# Patient Record
Sex: Female | Born: 1970 | Race: White | Hispanic: No | Marital: Married | Smoking: Never smoker
Health system: Southern US, Community
[De-identification: ages and names within clinical notes are randomized; demographics above are authoritative.]

## PROBLEM LIST (undated history)

## (undated) DIAGNOSIS — M1990 Unspecified osteoarthritis, unspecified site: Secondary | ICD-10-CM

## (undated) DIAGNOSIS — F419 Anxiety disorder, unspecified: Secondary | ICD-10-CM

## (undated) DIAGNOSIS — F32A Depression, unspecified: Secondary | ICD-10-CM

## (undated) DIAGNOSIS — M549 Dorsalgia, unspecified: Secondary | ICD-10-CM

## (undated) DIAGNOSIS — R42 Dizziness and giddiness: Secondary | ICD-10-CM

## (undated) DIAGNOSIS — F329 Major depressive disorder, single episode, unspecified: Secondary | ICD-10-CM

## (undated) HISTORY — PX: SHOULDER SURGERY: SHX246

## (undated) HISTORY — PX: TUBAL LIGATION: SHX77

---

## 2005-01-19 ENCOUNTER — Emergency Department: Payer: Self-pay | Admitting: Emergency Medicine

## 2005-04-05 ENCOUNTER — Emergency Department (HOSPITAL_COMMUNITY): Admission: EM | Admit: 2005-04-05 | Discharge: 2005-04-05 | Payer: Self-pay | Admitting: *Deleted

## 2005-05-28 ENCOUNTER — Emergency Department: Payer: Self-pay | Admitting: Emergency Medicine

## 2005-06-08 ENCOUNTER — Emergency Department: Payer: Self-pay | Admitting: Emergency Medicine

## 2007-02-28 ENCOUNTER — Emergency Department (HOSPITAL_COMMUNITY): Admission: EM | Admit: 2007-02-28 | Discharge: 2007-02-28 | Payer: Self-pay | Admitting: Emergency Medicine

## 2007-07-13 ENCOUNTER — Emergency Department (HOSPITAL_COMMUNITY): Admission: EM | Admit: 2007-07-13 | Discharge: 2007-07-13 | Payer: Self-pay | Admitting: Emergency Medicine

## 2007-10-31 ENCOUNTER — Emergency Department (HOSPITAL_COMMUNITY): Admission: EM | Admit: 2007-10-31 | Discharge: 2007-10-31 | Payer: Self-pay | Admitting: Emergency Medicine

## 2008-02-15 ENCOUNTER — Emergency Department (HOSPITAL_COMMUNITY): Admission: EM | Admit: 2008-02-15 | Discharge: 2008-02-15 | Payer: Self-pay | Admitting: Emergency Medicine

## 2008-03-20 ENCOUNTER — Emergency Department (HOSPITAL_COMMUNITY): Admission: EM | Admit: 2008-03-20 | Discharge: 2008-03-20 | Payer: Self-pay | Admitting: Emergency Medicine

## 2008-04-20 ENCOUNTER — Emergency Department (HOSPITAL_COMMUNITY): Admission: EM | Admit: 2008-04-20 | Discharge: 2008-04-20 | Payer: Self-pay | Admitting: Emergency Medicine

## 2008-10-07 ENCOUNTER — Emergency Department (HOSPITAL_COMMUNITY): Admission: EM | Admit: 2008-10-07 | Discharge: 2008-10-07 | Payer: Self-pay | Admitting: Emergency Medicine

## 2009-11-26 ENCOUNTER — Emergency Department: Payer: Self-pay | Admitting: Emergency Medicine

## 2010-05-20 ENCOUNTER — Emergency Department (HOSPITAL_COMMUNITY): Admission: EM | Admit: 2010-05-20 | Discharge: 2010-05-20 | Payer: Self-pay | Admitting: Emergency Medicine

## 2010-12-07 LAB — GLUCOSE, CAPILLARY: Glucose-Capillary: 99 mg/dL (ref 70–99)

## 2011-10-29 ENCOUNTER — Emergency Department: Payer: Self-pay | Admitting: Emergency Medicine

## 2012-01-28 ENCOUNTER — Emergency Department: Payer: Self-pay | Admitting: *Deleted

## 2012-02-14 ENCOUNTER — Emergency Department: Payer: Self-pay | Admitting: Unknown Physician Specialty

## 2012-06-04 ENCOUNTER — Emergency Department: Payer: Self-pay | Admitting: Emergency Medicine

## 2012-06-04 LAB — COMPREHENSIVE METABOLIC PANEL
BUN: 12 mg/dL (ref 7–18)
Bilirubin,Total: 0.2 mg/dL (ref 0.2–1.0)
Chloride: 108 mmol/L — ABNORMAL HIGH (ref 98–107)
Creatinine: 0.58 mg/dL — ABNORMAL LOW (ref 0.60–1.30)
EGFR (African American): >60
SGPT (ALT): 13 U/L (ref 12–78)
Total Protein: 8.4 g/dL — ABNORMAL HIGH (ref 6.4–8.2)

## 2012-06-04 LAB — DRUG SCREEN, URINE
Amphetamines, Ur Screen: NEGATIVE (ref ?–1000)
MDMA (Ecstasy)Ur Screen: NEGATIVE (ref ?–500)
Opiate, Ur Screen: NEGATIVE (ref ?–300)
Phencyclidine (PCP) Ur S: NEGATIVE (ref ?–25)

## 2012-06-04 LAB — URINALYSIS, COMPLETE
Bilirubin,UR: NEGATIVE
Ph: 6 (ref 4.5–8.0)
Protein: NEGATIVE
RBC,UR: 2 /HPF (ref 0–5)
Squamous Epithelial: 5

## 2012-06-04 LAB — CBC
HCT: 40.6 % (ref 35.0–47.0)
HGB: 13.9 g/dL (ref 12.0–16.0)
Platelet: 356 10*3/uL (ref 150–440)
RBC: 4.81 10*6/uL (ref 3.80–5.20)
RDW: 13.1 % (ref 11.5–14.5)

## 2012-06-04 LAB — TSH: Thyroid Stimulating Horm: 1.31 u[IU]/mL

## 2013-02-06 IMAGING — CR DG FOOT COMPLETE 3+V*L*
1 series · 3 of 3 positions shown · non-contrast
Comparison: none

REASON FOR EXAM: foot pain for one month
COMMENTS:   May transport without cardiac monitor

PROCEDURE:     DXR - DXR FOOT LT COMP W/OBLIQUES  - January 28, 2012  [DATE]
RESULT:     No acute abnormality identified.

[Series 1: x foot ap left · 0.14mm/px · 3 of 3 slices shown]
[im 1/3]
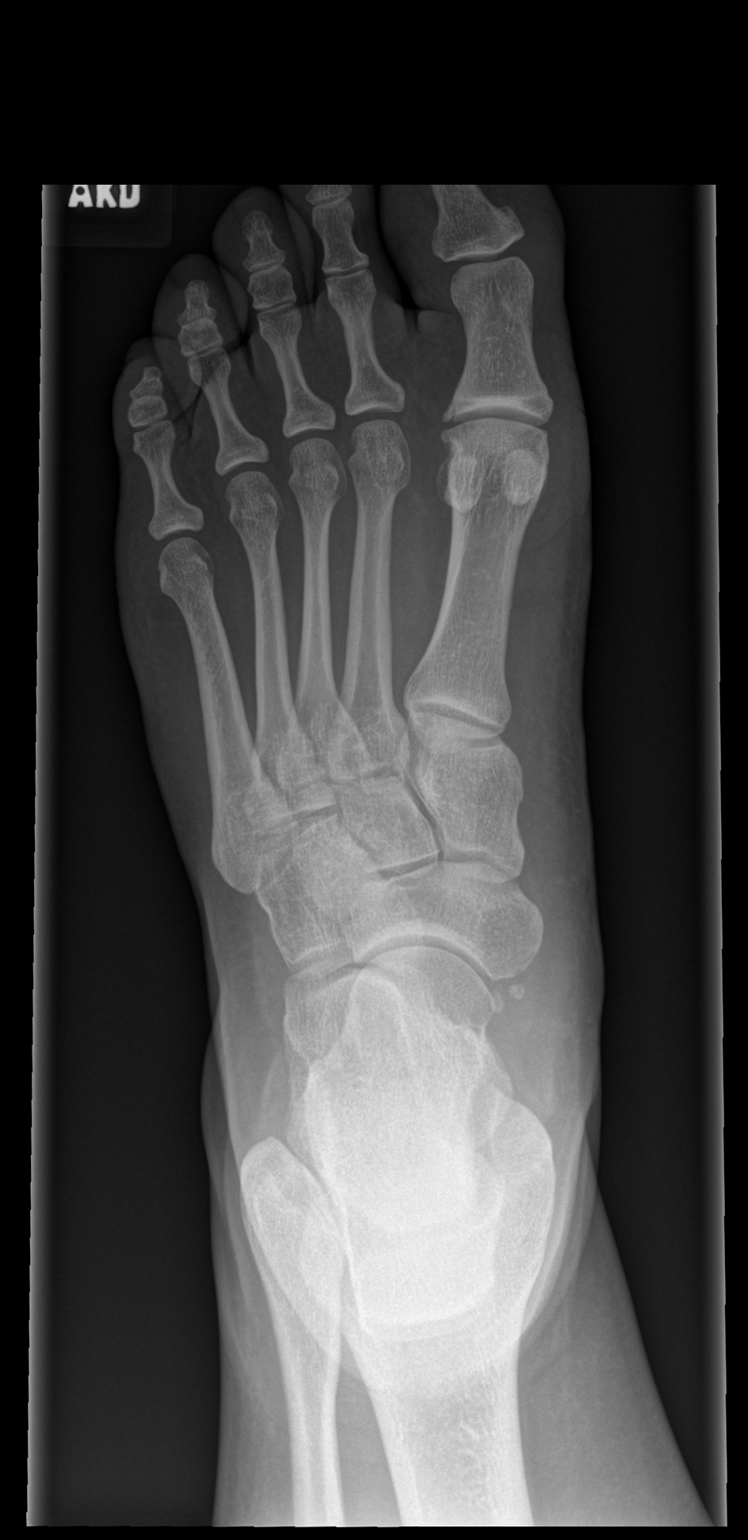
[im 2/3]
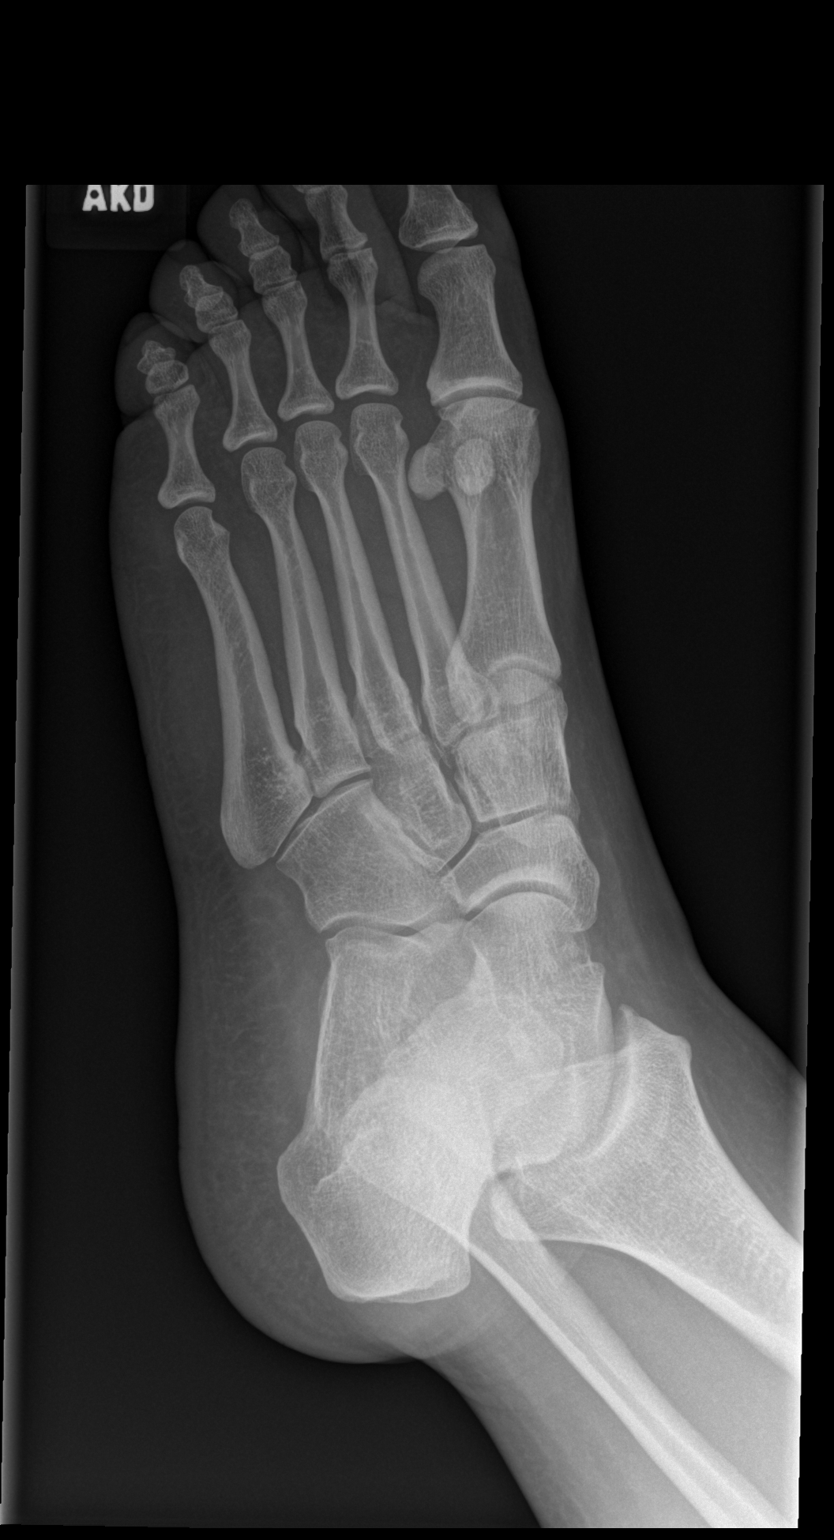
[im 3/3]
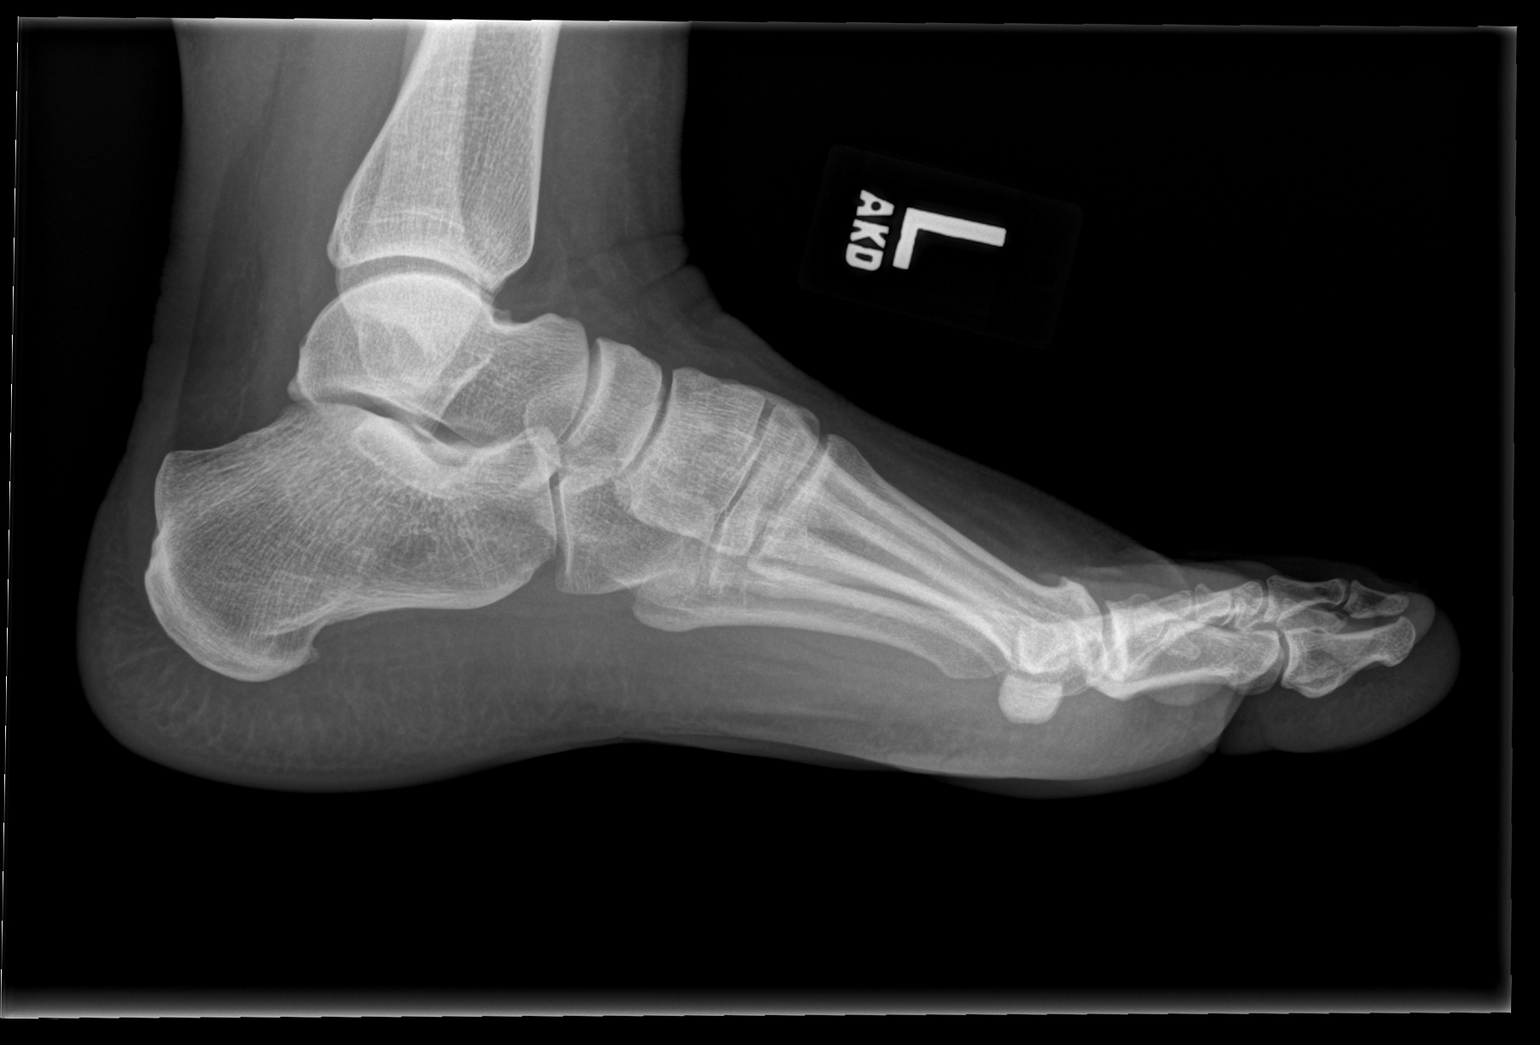

[3 of 3 positions shown; findings below may reference images not displayed]

IMPRESSION: No acute abnormality. Accessory ossicle is noted adjacent
to the navicular.

## 2013-03-10 ENCOUNTER — Emergency Department: Payer: Self-pay | Admitting: Emergency Medicine

## 2013-04-02 ENCOUNTER — Emergency Department: Payer: Self-pay | Admitting: Emergency Medicine

## 2013-07-10 ENCOUNTER — Emergency Department: Payer: Self-pay | Admitting: Emergency Medicine

## 2013-07-10 LAB — BASIC METABOLIC PANEL
Anion Gap: 7 (ref 7–16)
BUN: 10 mg/dL (ref 7–18)
Co2: 26 mmol/L (ref 21–32)
EGFR (Non-African Amer.): >60
Glucose: 136 mg/dL — ABNORMAL HIGH (ref 65–99)
Sodium: 136 mmol/L (ref 136–145)

## 2013-07-10 LAB — RAPID INFLUENZA A&B ANTIGENS

## 2013-07-10 LAB — CBC WITH DIFFERENTIAL/PLATELET
Eosinophil #: 0.1 10*3/uL (ref 0.0–0.7)
HGB: 13.8 g/dL (ref 12.0–16.0)
Lymphocyte %: 29.3 %
MCH: 29 pg (ref 26.0–34.0)
Monocyte #: 0.6 x10 3/mm (ref 0.2–0.9)
Monocyte %: 5.8 %
Neutrophil #: 7 10*3/uL — ABNORMAL HIGH (ref 1.4–6.5)
Platelet: 362 10*3/uL (ref 150–440)
RDW: 12.8 % (ref 11.5–14.5)
WBC: 11.2 10*3/uL — ABNORMAL HIGH (ref 3.6–11.0)

## 2013-07-15 ENCOUNTER — Emergency Department: Payer: Self-pay | Admitting: Emergency Medicine

## 2013-07-15 LAB — COMPREHENSIVE METABOLIC PANEL
Calcium, Total: 9.1 mg/dL (ref 8.5–10.1)
Chloride: 105 mmol/L (ref 98–107)
Co2: 28 mmol/L (ref 21–32)
Creatinine: 0.73 mg/dL (ref 0.60–1.30)
EGFR (African American): >60
Osmolality: 274 (ref 275–301)
SGOT(AST): 21 U/L (ref 15–37)
SGPT (ALT): 17 U/L (ref 12–78)
Sodium: 138 mmol/L (ref 136–145)

## 2013-07-15 LAB — URINALYSIS, COMPLETE
Bacteria: NONE SEEN
Blood: NEGATIVE
Ketone: NEGATIVE
Leukocyte Esterase: NEGATIVE
Nitrite: NEGATIVE
Ph: 5 (ref 4.5–8.0)
Protein: NEGATIVE
Squamous Epithelial: 4
WBC UR: 1 /HPF (ref 0–5)

## 2013-07-15 LAB — CBC
MCHC: 35.1 g/dL (ref 32.0–36.0)
MCV: 82 fL (ref 80–100)
Platelet: 372 10*3/uL (ref 150–440)
RBC: 4.38 10*6/uL (ref 3.80–5.20)

## 2014-05-29 ENCOUNTER — Emergency Department: Payer: Self-pay | Admitting: Emergency Medicine

## 2014-11-21 ENCOUNTER — Ambulatory Visit: Payer: Self-pay | Admitting: Family Medicine

## 2015-04-24 ENCOUNTER — Emergency Department
Admission: EM | Admit: 2015-04-24 | Discharge: 2015-04-24 | Disposition: A | Discharge status: Home / Self Care | Payer: BLUE CROSS/BLUE SHIELD | Attending: Emergency Medicine | Admitting: Emergency Medicine

## 2015-04-24 ENCOUNTER — Encounter: Payer: Self-pay | Admitting: Emergency Medicine

## 2015-04-24 DIAGNOSIS — Z3202 Encounter for pregnancy test, result negative: Secondary | ICD-10-CM | POA: Insufficient documentation

## 2015-04-24 DIAGNOSIS — K529 Noninfective gastroenteritis and colitis, unspecified: Secondary | ICD-10-CM | POA: Insufficient documentation

## 2015-04-24 DIAGNOSIS — R1084 Generalized abdominal pain: Secondary | ICD-10-CM | POA: Diagnosis present

## 2015-04-24 HISTORY — DX: Depression, unspecified: F32.A

## 2015-04-24 HISTORY — DX: Major depressive disorder, single episode, unspecified: F32.9

## 2015-04-24 HISTORY — DX: Anxiety disorder, unspecified: F41.9

## 2015-04-24 LAB — COMPREHENSIVE METABOLIC PANEL
ALT: 17 U/L (ref 14–54)
AST: 22 U/L (ref 15–41)
Albumin: 3.5 g/dL (ref 3.5–5.0)
Alkaline Phosphatase: 53 U/L (ref 38–126)
Anion gap: 7 (ref 5–15)
BUN: 10 mg/dL (ref 6–20)
CALCIUM: 9 mg/dL (ref 8.9–10.3)
CO2: 24 mmol/L (ref 22–32)
CREATININE: 0.67 mg/dL (ref 0.44–1.00)
Chloride: 104 mmol/L (ref 101–111)
GLUCOSE: 185 mg/dL — AB (ref 65–99)
POTASSIUM: 3.6 mmol/L (ref 3.5–5.1)
Sodium: 135 mmol/L (ref 135–145)
TOTAL PROTEIN: 7.8 g/dL (ref 6.5–8.1)
Total Bilirubin: 0.3 mg/dL (ref 0.3–1.2)

## 2015-04-24 LAB — URINALYSIS COMPLETE WITH MICROSCOPIC (ARMC ONLY)
Bilirubin Urine: NEGATIVE
Glucose, UA: 50 mg/dL — AB
Hgb urine dipstick: NEGATIVE
Nitrite: NEGATIVE
PH: 5 (ref 5.0–8.0)
Protein, ur: NEGATIVE mg/dL
RBC / HPF: NONE SEEN RBC/hpf (ref 0–5)
SPECIFIC GRAVITY, URINE: 1.028 (ref 1.005–1.030)

## 2015-04-24 LAB — CBC
HEMATOCRIT: 38 % (ref 35.0–47.0)
Hemoglobin: 12.9 g/dL (ref 12.0–16.0)
MCH: 27.5 pg (ref 26.0–34.0)
MCHC: 33.9 g/dL (ref 32.0–36.0)
MCV: 81.2 fL (ref 80.0–100.0)
PLATELETS: 344 10*3/uL (ref 150–440)
RBC: 4.68 MIL/uL (ref 3.80–5.20)
RDW: 12.9 % (ref 11.5–14.5)
WBC: 11.6 10*3/uL — AB (ref 3.6–11.0)

## 2015-04-24 LAB — POCT PREGNANCY, URINE: Preg Test, Ur: NEGATIVE

## 2015-04-24 LAB — LIPASE, BLOOD: LIPASE: 33 U/L (ref 22–51)

## 2015-04-24 MED ORDER — ONDANSETRON 4 MG PO TBDP: 4.0000 mg | ORAL_TABLET | Freq: Three times a day (TID) | ORAL | Status: DC | PRN

## 2015-04-24 NOTE — ED Provider Notes (Signed)
[facility] Emergency Department Provider Note  Time seen: 2:54 PM  I have reviewed the triage vital signs and the nursing notes.   HISTORY  Chief Complaint Abdominal Pain    HPI Marie Sawyer is a 44 y.o. female with a past medical history of anxiety, diabetes, depression presents to the emergency department with nausea, vomiting, diarrhea for the last 2 days. According to the patient since yesterday morning she has been having nausea, vomiting, diarrhea. States the vomiting has resolved, but continues with nausea and diarrhea today. Patient missed work last night. States some mild diffuse abdominal cramping, but no "pain". Denies fever, black or bloody stool or vomit, chest pain, shortness of breath. Denies any modifying factors.     Past Medical History  Diagnosis Date  . Diabetes mellitus without complication     borderline  . Depression   . Anxiety     There are no active problems to display for this patient.   Past Surgical History  Procedure Laterality Date  . Cesarean section    . Tubal ligation      No current outpatient prescriptions on file.  Allergies Review of patient's allergies indicates no known allergies.  No family history on file.  Social History History  Substance Use Topics  . Smoking status: Never Smoker   . Smokeless tobacco: Not on file  . Alcohol Use: No    Review of Systems Constitutional: Negative for fever. Cardiovascular: Negative for chest pain. Respiratory: Negative for shortness of breath. Gastrointestinal: Mild abdominal cramping, positive for nausea, vomiting, diarrhea. Genitourinary: Negative for dysuria. Musculoskeletal: Negative for back pain Neurological: Negative for headache 10-point ROS otherwise negative.  ____________________________________________   PHYSICAL EXAM:  VITAL SIGNS: ED Triage Vitals  Enc Vitals Group     BP 04/24/15 1147 121/76 mmHg     Pulse Rate 04/24/15 1147 88      Resp 04/24/15 1147 20     Temp 04/24/15 1147 98.5 F (36.9 C)     Temp Source 04/24/15 1147 Oral     SpO2 04/24/15 1147 98 %     Weight 04/24/15 1147 175 lb (79.379 kg)     Height 04/24/15 1147  (1.499 m)     Head Cir --      Peak Flow --      Pain Score 04/24/15 1156 6     Pain Loc --      Pain Edu? --      Excl. in GC? --     Constitutional: Alert and oriented. Well appearing and in no distress. Eyes: Normal exam ENT   Mouth/Throat: Mucous membranes are moist. Cardiovascular: Normal rate, regular rhythm. No murmur Respiratory: Normal respiratory effort without tachypnea nor retractions. Breath sounds are clear and equal bilaterally. No wheezes/rales/rhonchi. Gastrointestinal: Soft and nontender. No distention.  Musculoskeletal: Nontender with normal range of motion in all extremities Neurologic:  Normal speech and language. No gross focal neurologic deficits Skin:  Skin is warm, dry and intact.  Psychiatric: Mood and affect are normal. Speech and behavior are normal.   ____________________________________________   INITIAL IMPRESSION / ASSESSMENT AND PLAN / ED COURSE  Pertinent labs & imaging results that were available during my care of the patient were reviewed by me and considered in my medical decision making (see chart for details).  Labs are within normal limits. Urinalysis shows no sign of urinary tract infection. Slight white blood cell count elevation at 11.6. Patient with likely gastroenteritis. We will discharge  with Zofran as needed, and over-the-counter Imodium as needed. Patient agreeable to plan. I encourage by mouth fluids.  ____________________________________________   FINAL CLINICAL IMPRESSION(S) / ED DIAGNOSES  Gastroenteritis   Minna Antis, MD 04/24/15 1459

## 2015-04-24 NOTE — Discharge Instructions (Signed)

## 2015-04-24 NOTE — ED Notes (Signed)
Pt reports abdominal pain since Friday, reports lower abdominal pain. Pt with vomiting and diarrhea.

## 2015-08-25 ENCOUNTER — Encounter: Payer: Self-pay | Admitting: General Practice

## 2015-08-25 ENCOUNTER — Emergency Department: Payer: BLUE CROSS/BLUE SHIELD

## 2015-08-25 ENCOUNTER — Emergency Department
Admission: EM | Admit: 2015-08-25 | Discharge: 2015-08-25 | Disposition: A | Discharge status: Home / Self Care | Payer: BLUE CROSS/BLUE SHIELD | Attending: Emergency Medicine | Admitting: Emergency Medicine

## 2015-08-25 DIAGNOSIS — Z79899 Other long term (current) drug therapy: Secondary | ICD-10-CM | POA: Insufficient documentation

## 2015-08-25 DIAGNOSIS — M546 Pain in thoracic spine: Secondary | ICD-10-CM | POA: Diagnosis not present

## 2015-08-25 DIAGNOSIS — E119 Type 2 diabetes mellitus without complications: Secondary | ICD-10-CM | POA: Diagnosis not present

## 2015-08-25 DIAGNOSIS — R079 Chest pain, unspecified: Secondary | ICD-10-CM | POA: Insufficient documentation

## 2015-08-25 DIAGNOSIS — M549 Dorsalgia, unspecified: Secondary | ICD-10-CM | POA: Diagnosis present

## 2015-08-25 LAB — COMPREHENSIVE METABOLIC PANEL
ALBUMIN: 3.4 g/dL — AB (ref 3.5–5.0)
ALK PHOS: 48 U/L (ref 38–126)
ALT: 13 U/L — ABNORMAL LOW (ref 14–54)
ANION GAP: 6 (ref 5–15)
AST: 19 U/L (ref 15–41)
BUN: 10 mg/dL (ref 6–20)
CHLORIDE: 108 mmol/L (ref 101–111)
CO2: 24 mmol/L (ref 22–32)
Calcium: 9 mg/dL (ref 8.9–10.3)
Creatinine, Ser: 0.67 mg/dL (ref 0.44–1.00)
GFR calc Af Amer: >60 mL/min (ref >60)
GFR calc non Af Amer: >60 mL/min (ref >60)
GLUCOSE: 136 mg/dL — AB (ref 65–99)
POTASSIUM: 3.8 mmol/L (ref 3.5–5.1)
SODIUM: 138 mmol/L (ref 135–145)
Total Bilirubin: <0.1 mg/dL — ABNORMAL LOW (ref 0.3–1.2)
Total Protein: 7.4 g/dL (ref 6.5–8.1)

## 2015-08-25 LAB — CBC
HCT: 37.3 % (ref 35.0–47.0)
HEMOGLOBIN: 12.6 g/dL (ref 12.0–16.0)
MCH: 27.8 pg (ref 26.0–34.0)
MCHC: 33.8 g/dL (ref 32.0–36.0)
MCV: 82.4 fL (ref 80.0–100.0)
PLATELETS: 342 10*3/uL (ref 150–440)
RBC: 4.53 MIL/uL (ref 3.80–5.20)
RDW: 13.2 % (ref 11.5–14.5)
WBC: 10.9 10*3/uL (ref 3.6–11.0)

## 2015-08-25 LAB — FIBRIN DERIVATIVES D-DIMER (ARMC ONLY): Fibrin derivatives D-dimer (ARMC): 502 — ABNORMAL HIGH (ref 0–499)

## 2015-08-25 LAB — TROPONIN I: Troponin I: <0.03 ng/mL (ref <0.031)

## 2015-08-25 MED ORDER — OXYCODONE-ACETAMINOPHEN 5-325 MG PO TABS
ORAL_TABLET | ORAL | Status: AC
  Administered 2015-08-25: 1 via ORAL
  Filled 2015-08-25: qty 1

## 2015-08-25 MED ORDER — IOHEXOL 350 MG/ML SOLN
100.0000 mL | Freq: Once | INTRAVENOUS | Status: AC | PRN
  Administered 2015-08-25: 100 mL via INTRAVENOUS

## 2015-08-25 MED ORDER — OXYCODONE-ACETAMINOPHEN 5-325 MG PO TABS
1.0000 | ORAL_TABLET | Freq: Once | ORAL | Status: AC
  Administered 2015-08-25: 1 via ORAL

## 2015-08-25 MED ORDER — HYDROCODONE-ACETAMINOPHEN 5-325 MG PO TABS: 1.0000 | ORAL_TABLET | ORAL | Status: DC | PRN

## 2015-08-25 NOTE — ED Notes (Signed)
Patient advised not to return to work tonight due to medication given in ED. Patient verbalized understanding.

## 2015-08-25 NOTE — ED Notes (Signed)
Pt in with co upper back pain for few days, denies any known injury.

## 2015-08-25 NOTE — ED Notes (Signed)
Patient transported to X-ray 

## 2015-08-25 NOTE — Discharge Instructions (Signed)
Back Pain, Adult Back pain is very common. The pain often gets better over time. The cause of back pain is usually not dangerous. Most people can learn to manage their back pain on their own.  HOME CARE  Watch your back pain for any changes. The following actions may help to lessen any pain you are feeling:  Stay active. Start with short walks on flat ground if you can. Try to walk farther each day.  Exercise regularly as told by your doctor. Exercise helps your back heal faster. It also helps avoid future injury by keeping your muscles strong and flexible.  Do not sit, drive, or stand in one place for more than 30 minutes.  Do not stay in bed. Resting more than 1-2 days can slow down your recovery.  Be careful when you bend or lift an object. Use good form when lifting:  Bend at your knees.  Keep the object close to your body.  Do not twist.  Sleep on a firm mattress. Lie on your side, and bend your knees. If you lie on your back, put a pillow under your knees.  Take medicines only as told by your doctor.  Put ice on the injured area.  Put ice in a plastic bag.  Place a towel between your skin and the bag.  Leave the ice on for 20 minutes, 2-3 times a day for the first 2-3 days. After that, you can switch between ice and heat packs.  Avoid feeling anxious or stressed. Find good ways to deal with stress, such as exercise.  Maintain a healthy weight. Extra weight puts stress on your back. GET HELP IF:   You have pain that does not go away with rest or medicine.  You have worsening pain that goes down into your legs or buttocks.  You have pain that does not get better in one week.  You have pain at night.  You lose weight.  You have a fever or chills. GET HELP RIGHT AWAY IF:   You cannot control when you poop (bowel movement) or pee (urinate).  Your arms or legs feel weak.  Your arms or legs lose feeling (numbness).  You feel sick to your stomach (nauseous) or  throw up (vomit).  You have belly (abdominal) pain.  You feel like you may pass out (faint).   This information is not intended to replace advice given to you by your health care provider. Make sure you discuss any questions you have with your health care provider.   Document Released: 02/26/2008 Document Revised: 09/30/2014 Document Reviewed: 01/11/2014 Elsevier Interactive Patient Education 2016 [employer]  Chest Wall Pain Chest wall pain is pain in or around the bones and muscles of your chest. Sometimes, an injury causes this pain. Sometimes, the cause may not be known. This pain may take several weeks or longer to get better. HOME CARE INSTRUCTIONS  Pay attention to any changes in your symptoms. Take these actions to help with your pain:   Rest as told by your health care provider.   Avoid activities that cause pain. These include any activities that use your chest muscles or your abdominal and side muscles to lift heavy items.   If directed, apply ice to the painful area:  Put ice in a plastic bag.  Place a towel between your skin and the bag.  Leave the ice on for 20 minutes, 2-3 times per day.  Take over-the-counter and prescription medicines only as told by your  health care provider.  Do not use tobacco products, including cigarettes, chewing tobacco, and e-cigarettes. If you need help quitting, ask your health care provider.  Keep all follow-up visits as told by your health care provider. This is important. SEEK MEDICAL CARE IF:  You have a fever.  Your chest pain becomes worse.  You have new symptoms. SEEK IMMEDIATE MEDICAL CARE IF:  You have nausea or vomiting.  You feel sweaty or light-headed.  You have a cough with phlegm (sputum) or you cough up blood.  You develop shortness of breath.   This information is not intended to replace advice given to you by your health care provider. Make sure you discuss any questions you have with your health care  provider.   Document Released: 09/09/2005 Document Revised: 05/31/2015 Document Reviewed: 12/05/2014 Elsevier Interactive Patient Education [employer]

## 2015-08-25 NOTE — ED Notes (Signed)
Patient transported to CT 

## 2015-08-25 NOTE — ED Provider Notes (Signed)
[facility] Emergency Department Provider Note  Time seen: 2:27 AM  I have reviewed the triage vital signs and the nursing notes.   HISTORY  Chief Complaint Back Pain    HPI Marie Sawyer is a 44 y.o. female with a past medical history of diabetes, anxiety, depression who presents the emergency department with right posterior chest pain. According to the patient she was asleep when she was awoken by significant right posterior chest pain. States this is much worse with movement, somewhat worse with deep inspiration. She states she has been coughing over the last 2-3 days. Denies any trauma to the area. The patient does take birth control to regulate her period ends. Denies any recent leg pain or swelling.     Past Medical History  Diagnosis Date  . Diabetes mellitus without complication (HCC)     borderline  . Depression   . Anxiety     There are no active problems to display for this patient.   Past Surgical History  Procedure Laterality Date  . Cesarean section    . Tubal ligation      Current Outpatient Rx  Name  Route  Sig  Dispense  Refill  . buPROPion (WELLBUTRIN XL) 300 MG 24 hr tablet   Oral   Take 300 mg by mouth daily.         . clonazePAM (KLONOPIN) 1 MG tablet   Oral   Take 1 mg by mouth 2 (two) times daily as needed for anxiety.         . traMADol (ULTRAM) 50 MG tablet   Oral   Take 50 mg by mouth every 6 (six) hours as needed for moderate pain.         Marland Kitchen. zolpidem (AMBIEN) 10 MG tablet   Oral   Take 10 mg by mouth at bedtime as needed for sleep.         Marland Kitchen. ondansetron (ZOFRAN ODT) 4 MG disintegrating tablet   Oral   Take 1 tablet (4 mg total) by mouth every 8 (eight) hours as needed for nausea or vomiting.   20 tablet   0     Allergies Review of patient's allergies indicates no known allergies.  History reviewed. No pertinent family history.  Social History Social History  Substance Use Topics  .  Smoking status: Never Smoker   . Smokeless tobacco: None  . Alcohol Use: No    Review of Systems Constitutional: Negative for fever. Cardiovascular: Positive right upper back/right posterior chest pain Respiratory: Negative for shortness of breath. Gastrointestinal: Negative for abdominal pain Musculoskeletal: Positive for right upper back pain Neurological: Negative for headache 10-point ROS otherwise negative.  ____________________________________________   PHYSICAL EXAM:  VITAL SIGNS: ED Triage Vitals  Enc Vitals Group     BP 08/25/15 0200 156/89 mmHg     Pulse Rate 08/25/15 0200 106     Resp 08/25/15 0200 18     Temp 08/25/15 0200 98.5 F (36.9 C)     Temp Source 08/25/15 0200 Oral     SpO2 08/25/15 0200 96 %     Weight 08/25/15 0200 170 lb (77.111 kg)     Height 08/25/15 0200 4\' 11"  (1.499 m)     Head Cir --      Peak Flow --      Pain Score 08/25/15 0200 10     Pain Loc --      Pain Edu? --      Excl.  in GC? --     Constitutional: Alert and oriented. Well appearing and in no distress. Eyes: Normal exam ENT   Head: Normocephalic and atraumatic.   Mouth/Throat: Mucous membranes are moist. Cardiovascular: Normal rate, regular rhythm. No murmur Respiratory: Normal respiratory effort without tachypnea nor retractions. Breath sounds are clear. Significant tenderness to palpation below the right scapula. Gastrointestinal: Soft and nontender. No distention. Musculoskeletal: Nontender with normal range of motion in all extremities. No lower extremity tenderness or edema. Neurologic:  Normal speech and language. No gross focal neurologic deficits Skin:  Skin is warm, dry and intact.  Psychiatric: Mood and affect are normal. Speech and behavior are normal.   ____________________________________________    EKG  EKG reviewed and interpreted by myself shows normal sinus rhythm at 83 bpm, narrow QRS, normal axis, normal and full, no ST changes  present.  ____________________________________________    RADIOLOGY  CT a negative  ____________________________________________   INITIAL IMPRESSION / ASSESSMENT AND PLAN / ED COURSE  Pertinent labs & imaging results that were available during my care of the patient were reviewed by me and considered in my medical decision making (see chart for details).  Patient presented to the emergency department with right posterior chest pain. States it occurred acutely this evening around 6-7 p.m. Has not improved. Patient is on estrogen to regulate her periods, denies any recent leg pain or swelling. Denies any history of DVT. Denies any shortness of breath. Patient does have mild tachycardia but admits she is very anxious. Given the acute onset of pain during rest we'll proceed with lab workup and a chest x-ray. We'll also obtain a d-dimer.  D-dimer has resulted at 502. We'll pursue the CT angiography of the chest to rule out pulmonary emboli. Patient is agreeable to this plan.  CT angiography negative. Labs within normal limits. We'll discharge home with a short course of pain medication for likely musculoskeletal pain. Patient agreeable  ____________________________________________   FINAL CLINICAL IMPRESSION(S) / ED DIAGNOSES  Right posterior chest pain   Minna Antis, MD 08/25/15 [phone]

## 2015-12-03 DIAGNOSIS — Z79899 Other long term (current) drug therapy: Secondary | ICD-10-CM | POA: Diagnosis not present

## 2015-12-03 DIAGNOSIS — M5442 Lumbago with sciatica, left side: Secondary | ICD-10-CM | POA: Insufficient documentation

## 2015-12-03 DIAGNOSIS — M545 Low back pain: Secondary | ICD-10-CM | POA: Diagnosis present

## 2015-12-03 NOTE — ED Notes (Signed)
Pt states that she has been having lower back pain for the past couple days but states off and on for years, pt denies recent injury, states while at work the other night she was having difficulty walking and moving due to the pain

## 2015-12-04 ENCOUNTER — Emergency Department
Admission: EM | Admit: 2015-12-04 | Discharge: 2015-12-04 | Disposition: A | Discharge status: Home / Self Care | Payer: BLUE CROSS/BLUE SHIELD | Attending: Emergency Medicine | Admitting: Emergency Medicine

## 2015-12-04 ENCOUNTER — Encounter: Payer: Self-pay | Admitting: Emergency Medicine

## 2015-12-04 DIAGNOSIS — M5442 Lumbago with sciatica, left side: Secondary | ICD-10-CM

## 2015-12-04 HISTORY — DX: Dorsalgia, unspecified: M54.9

## 2015-12-04 MED ORDER — LIDOCAINE 5 % EX PTCH: 1.0000 | MEDICATED_PATCH | CUTANEOUS | Status: DC

## 2015-12-04 MED ORDER — LORAZEPAM 0.5 MG PO TABS
ORAL_TABLET | ORAL | Status: AC
  Filled 2015-12-04: qty 1

## 2015-12-04 MED ORDER — DIAZEPAM 5 MG PO TABS
5.0000 mg | ORAL_TABLET | Freq: Once | ORAL | Status: AC
  Administered 2015-12-04: 5 mg via ORAL
  Filled 2015-12-04: qty 1

## 2015-12-04 MED ORDER — LIDOCAINE 5 % EX PTCH
1.0000 | MEDICATED_PATCH | CUTANEOUS | Status: DC
  Administered 2015-12-04: 1 via TRANSDERMAL
  Filled 2015-12-04 (×2): qty 1

## 2015-12-04 MED ORDER — ETODOLAC 200 MG PO CAPS: 200.0000 mg | ORAL_CAPSULE | Freq: Three times a day (TID) | ORAL | Status: DC

## 2015-12-04 MED ORDER — KETOROLAC TROMETHAMINE 60 MG/2ML IM SOLN
60.0000 mg | Freq: Once | INTRAMUSCULAR | Status: AC
  Administered 2015-12-04: 60 mg via INTRAMUSCULAR
  Filled 2015-12-04: qty 2

## 2015-12-04 NOTE — Discharge Instructions (Signed)
Chronic Back Pain ° When back pain lasts longer than 3 months, it is called chronic back pain. People with chronic back pain often go through certain periods that are more intense (flare-ups).  °CAUSES °Chronic back pain can be caused by wear and tear (degeneration) on different structures in your back. These structures include: °· The bones of your spine (vertebrae) and the joints surrounding your spinal cord and nerve roots (facets). °· The strong, fibrous tissues that connect your vertebrae (ligaments). °Degeneration of these structures may result in pressure on your nerves. This can lead to constant pain. °HOME CARE INSTRUCTIONS °· Avoid bending, heavy lifting, prolonged sitting, and activities which make the problem worse. °· Take brief periods of rest throughout the day to reduce your pain. Lying down or standing usually is better than sitting while you are resting. °· Take over-the-counter or prescription medicines only as directed by your caregiver. °SEEK IMMEDIATE MEDICAL CARE IF:  °· You have weakness or numbness in one of your legs or feet. °· You have trouble controlling your bladder or bowels. °· You have nausea, vomiting, abdominal pain, shortness of breath, or fainting. °  °This information is not intended to replace advice given to you by your health care provider. Make sure you discuss any questions you have with your health care provider. °  °Document Released: 10/17/2004 Document Revised: 12/02/2011 Document Reviewed: 02/27/2015 °Elsevier Interactive Patient Education ©2016 Elsevier Inc. ° °Sciatica °Sciatica is pain, weakness, numbness, or tingling along the path of the sciatic nerve. The nerve starts in the lower back and runs down the back of each leg. The nerve controls the muscles in the lower leg and in the back of the knee, while also providing sensation to the back of the thigh, lower leg, and the sole of your foot. Sciatica is a symptom of another medical condition. For instance, nerve  damage or certain conditions, such as a herniated disk or bone spur on the spine, pinch or put pressure on the sciatic nerve. This causes the pain, weakness, or other sensations normally associated with sciatica. Generally, sciatica only affects one side of the body. °CAUSES  °· Herniated or slipped disc. °· Degenerative disk disease. °· A pain disorder involving the narrow muscle in the buttocks (piriformis syndrome). °· Pelvic injury or fracture. °· Pregnancy. °· Tumor (rare). °SYMPTOMS  °Symptoms can vary from mild to very severe. The symptoms usually travel from the low back to the buttocks and down the back of the leg. Symptoms can include: °· Mild tingling or dull aches in the lower back, leg, or hip. °· Numbness in the back of the calf or sole of the foot. °· Burning sensations in the lower back, leg, or hip. °· Sharp pains in the lower back, leg, or hip. °· Leg weakness. °· Severe back pain inhibiting movement. °These symptoms may get worse with coughing, sneezing, laughing, or prolonged sitting or standing. Also, being overweight may worsen symptoms. °DIAGNOSIS  °Your caregiver will perform a physical exam to look for common symptoms of sciatica. He or she may ask you to do certain movements or activities that would trigger sciatic nerve pain. Other tests may be performed to find the cause of the sciatica. These may include: °· Blood tests. °· X-rays. °· Imaging tests, such as an MRI or CT scan. °TREATMENT  °Treatment is directed at the cause of the sciatic pain. Sometimes, treatment is not necessary and the pain and discomfort goes away on its own. If treatment is needed, your   caregiver may suggest: °· Over-the-counter medicines to relieve pain. °· Prescription medicines, such as anti-inflammatory medicine, muscle relaxants, or narcotics. °· Applying heat or ice to the painful area. °· Steroid injections to lessen pain, irritation, and inflammation around the nerve. °· Reducing activity during periods of  pain. °· Exercising and stretching to strengthen your abdomen and improve flexibility of your spine. Your caregiver may suggest losing weight if the extra weight makes the back pain worse. °· Physical therapy. °· Surgery to eliminate what is pressing or pinching the nerve, such as a bone spur or part of a herniated disk. °HOME CARE INSTRUCTIONS  °· Only take over-the-counter or prescription medicines for pain or discomfort as directed by your caregiver. °· Apply ice to the affected area for 20 minutes, 3-4 times a day for the first 48-72 hours. Then try heat in the same way. °· Exercise, stretch, or perform your usual activities if these do not aggravate your pain. °· Attend physical therapy sessions as directed by your caregiver. °· Keep all follow-up appointments as directed by your caregiver. °· Do not wear high heels or shoes that do not provide proper support. °· Check your mattress to see if it is too soft. A firm mattress may lessen your pain and discomfort. °SEEK IMMEDIATE MEDICAL CARE IF:  °· You lose control of your bowel or bladder (incontinence). °· You have increasing weakness in the lower back, pelvis, buttocks, or legs. °· You have redness or swelling of your back. °· You have a burning sensation when you urinate. °· You have pain that gets worse when you lie down or awakens you at night. °· Your pain is worse than you have experienced in the past. °· Your pain is lasting longer than 4 weeks. °· You are suddenly losing weight without reason. °MAKE SURE YOU: °· Understand these instructions. °· Will watch your condition. °· Will get help right away if you are not doing well or get worse. °  °This information is not intended to replace advice given to you by your health care provider. Make sure you discuss any questions you have with your health care provider. °  °Document Released: 09/03/2001 Document Revised: 05/31/2015 Document Reviewed: 01/19/2012 °Elsevier Interactive Patient Education ©2016  [employer] ° °

## 2015-12-04 NOTE — ED Notes (Signed)
Pt advised cannot drive after received valium. Pt states daughter will driver her home.

## 2015-12-04 NOTE — ED Provider Notes (Signed)
[facility] Emergency Department Provider Note  ____________________________________________  Time seen: Approximately 0047 AM  I have reviewed the triage vital signs and the nursing notes.   HISTORY  Chief Complaint Back Pain    HPI Shelva Majesticracy M Cercone is a 45 y.o. female comes into the hospital with a complaint of back pain. The patient reports that she has had problems with her back on and off and it seems to have flared up. The patient reports the symptoms started when she was at work on Thursday. She denies any recent trauma but reports that she did fall at work a year ago. The patient reports that this time it just started out of nowhere. The patient has been taking tramadol as needed but reports it only helps for a second. She reports the pain is across her lower back and it shoots down her left leg. The patient rates her pain a 7 out of 10 in intensity but reports is not as bad as it is been in the past. She reports though that it hurts to walk and move which is what brought her into the hospital today. The patient has not followed up with her primary care physician.   Past Medical History  Diagnosis Date  . Diabetes mellitus without complication (HCC)     borderline  . Depression   . Anxiety   . Back pain     There are no active problems to display for this patient.   Past Surgical History  Procedure Laterality Date  . Cesarean section    . Tubal ligation      Current Outpatient Rx  Name  Route  Sig  Dispense  Refill  . buPROPion (WELLBUTRIN XL) 300 MG 24 hr tablet   Oral   Take 300 mg by mouth daily.         . clonazePAM (KLONOPIN) 1 MG tablet   Oral   Take 1 mg by mouth 2 (two) times daily as needed for anxiety.         Marland Kitchen. etodolac (LODINE) 200 MG capsule   Oral   Take 1 capsule (200 mg total) by mouth every 8 (eight) hours.   12 capsule   0   . HYDROcodone-acetaminophen (NORCO/VICODIN) 5-325 MG tablet   Oral   Take 1 tablet by  mouth every 4 (four) hours as needed for moderate pain.   15 tablet   0   . lidocaine (LIDODERM) 5 %   Transdermal   Place 1 patch onto the skin daily. Remove & Discard patch within 12 hours or as directed by MD   10 patch   0   . ondansetron (ZOFRAN ODT) 4 MG disintegrating tablet   Oral   Take 1 tablet (4 mg total) by mouth every 8 (eight) hours as needed for nausea or vomiting.   20 tablet   0   . traMADol (ULTRAM) 50 MG tablet   Oral   Take 50 mg by mouth every 6 (six) hours as needed for moderate pain.         Marland Kitchen. zolpidem (AMBIEN) 10 MG tablet   Oral   Take 10 mg by mouth at bedtime as needed for sleep.           Allergies Review of patient's allergies indicates no known allergies.  History reviewed. No pertinent family history.  Social History Social History  Substance Use Topics  . Smoking status: Never Smoker   . Smokeless tobacco: None  .  Alcohol Use: No    Review of Systems Constitutional: No fever/chills Eyes: No visual changes. ENT: No sore throat. Cardiovascular: Denies chest pain. Respiratory: Denies shortness of breath. Gastrointestinal: No abdominal pain.  No nausea, no vomiting.  No diarrhea.  No constipation. Genitourinary: Negative for dysuria. Musculoskeletal: Back pain with pain radiating down her left leg Skin: Negative for rash. Neurological: Negative for headaches, focal weakness or numbness.  10-point ROS otherwise negative.  ____________________________________________   PHYSICAL EXAM:  VITAL SIGNS: ED Triage Vitals  Enc Vitals Group     BP 12/03/15 2347 108/78 mmHg     Pulse Rate 12/03/15 2347 93     Resp 12/03/15 2347 18     Temp 12/03/15 2347 98 F (36.7 C)     Temp Source 12/03/15 2347 Oral     SpO2 12/03/15 2347 97 %     Weight 12/03/15 2347 165 lb (74.844 kg)     Height 12/03/15 2347  (1.499 m)     Head Cir --      Peak Flow --      Pain Score 12/04/15 0030 8     Pain Loc --      Pain Edu? --       Excl. in GC? --     Constitutional: Alert and oriented. Well appearing and in mild distress. Eyes: Conjunctivae are normal. PERRL. EOMI. Head: Atraumatic. Nose: No congestion/rhinnorhea. Mouth/Throat: Mucous membranes are moist.  Oropharynx non-erythematous. Cardiovascular: Normal rate, regular rhythm. Grossly normal heart sounds.  Good peripheral circulation. Respiratory: Normal respiratory effort.  No retractions. Lungs CTAB. Gastrointestinal: Soft and nontender. No distention. Positive bowel sounds Musculoskeletal: No lower extremity tenderness nor edema.  Tenderness to palpation across lower back with positive straight leg raise bilaterally Neurologic:  Normal speech and language.  Skin:  Skin is warm, dry and intact.  Psychiatric: Mood and affect are normal.   ____________________________________________   LABS (all labs ordered are listed, but only abnormal results are displayed)  Labs Reviewed - No data to display ____________________________________________  EKG  None ____________________________________________  RADIOLOGY  None ____________________________________________   PROCEDURES  Procedure(s) performed: None  Critical Care performed: No  ____________________________________________   INITIAL IMPRESSION / ASSESSMENT AND PLAN / ED COURSE  Pertinent labs & imaging results that were available during my care of the patient were reviewed by me and considered in my medical decision making (see chart for details).  This is a 30 old female with a history of chronic back pain who comes into the hospital today with some back pain that flared up approximately 3-4 days ago. I will give the patient a dose of Toradol as well as a Lidoderm patch. I will also give the patient a dose of Valium and reassess her pain.  Patient's pain is improved at this time. I will discharge the patient home and have her follow-up with her primary care  physician. ____________________________________________   FINAL CLINICAL IMPRESSION(S) / ED DIAGNOSES  Final diagnoses:  Left-sided low back pain with left-sided sciatica      Rebecka Apley, MD 12/04/15 [phone]

## 2015-12-04 NOTE — ED Notes (Signed)
Report received from laurie, rn for lunch relief. 

## 2016-01-23 ENCOUNTER — Ambulatory Visit: Payer: Self-pay | Admitting: Family Medicine

## 2016-01-29 ENCOUNTER — Encounter: Payer: Self-pay | Admitting: Family Medicine

## 2016-01-29 ENCOUNTER — Ambulatory Visit (INDEPENDENT_AMBULATORY_CARE_PROVIDER_SITE_OTHER): Payer: Self-pay | Admitting: Family Medicine

## 2016-01-29 VITALS — BP 104/72 | HR 85 | Temp 97.8°F | Resp 16 | Ht 59.0 in | Wt 181.0 lb

## 2016-01-29 DIAGNOSIS — E785 Hyperlipidemia, unspecified: Secondary | ICD-10-CM

## 2016-01-29 DIAGNOSIS — M545 Low back pain, unspecified: Secondary | ICD-10-CM | POA: Insufficient documentation

## 2016-01-29 DIAGNOSIS — F32A Depression, unspecified: Secondary | ICD-10-CM

## 2016-01-29 DIAGNOSIS — R519 Headache, unspecified: Secondary | ICD-10-CM | POA: Insufficient documentation

## 2016-01-29 DIAGNOSIS — R202 Paresthesia of skin: Secondary | ICD-10-CM

## 2016-01-29 DIAGNOSIS — R51 Headache: Secondary | ICD-10-CM

## 2016-01-29 DIAGNOSIS — R6889 Other general symptoms and signs: Secondary | ICD-10-CM

## 2016-01-29 DIAGNOSIS — F329 Major depressive disorder, single episode, unspecified: Secondary | ICD-10-CM

## 2016-01-29 DIAGNOSIS — E049 Nontoxic goiter, unspecified: Secondary | ICD-10-CM | POA: Insufficient documentation

## 2016-01-29 MED ORDER — LIDOCAINE 5 % EX PTCH: 1.0000 | MEDICATED_PATCH | CUTANEOUS | Status: DC

## 2016-01-29 MED ORDER — CYCLOBENZAPRINE HCL 10 MG PO TABS: 10.0000 mg | ORAL_TABLET | Freq: Three times a day (TID) | ORAL | Status: DC | PRN

## 2016-01-29 NOTE — Assessment & Plan Note (Signed)
Renewed flexeril as needed. Discussed physical therapy as a option for pain management. Pt is not sure she has time right now. Have renewed lidocaine patches so prior authorization can be started. Feel this is a good option for pain control. Pt would like to continue to take tramadol PRN. Encouraged her not to take when driving or working. Recheck 1 mos.

## 2016-01-29 NOTE — Progress Notes (Signed)
Subjective:    Patient ID: Marie Sawyer, female    DOB: Dec 07, 1970, 45 y.o.   MRN: [id number]  HPI: Marie Sawyer is a 45 y.o. female presenting on 01/29/2016 for Establish Care   HPI  Pt presents to establish care today. Previous care provider was Franco NonesCheryl Lindley at [facility]  It has been 3months since Her last PCP visit. Records from previous provider will be requested and reviewed. Current medical problems include:  Depression: Has been on wellbutrin for 1 year. Is helping with her symptoms. Also taking clonazepam- was started on anxiety medication due high stress levels. Takes as needed- gets 90 tablets. Takes only as needed. Takes clonazepam 2-3 times per week. Mother passed in January. Husband diagnosed with cancer. Very stressed in past year.  Enlarged thyroid: Diagnosed 1 year ago. Did an US in the past was scheduled for one at the end of may. Unsure if they were checking thyroid function. Having some throat issues- feels like she clears her throat. Feels some fatigue. Weight gain. Cold intolerance.  Excessive daytime sleepiness- Feels tired. Not sleeping at night. Doesn't snore. Doesn't look like she stops breathing in sleep. Has been taking ambien to help with sleep. Doesn't keep her asleep for 8 hours. Falls asleep and has early morning awakenings. Tosses and turns. Doesn't take ambien every day. Takes only when she needs to sleep. Works 3rd shift- has difficulty sleeping. Midline low back pain: Was told she has a pinched nerve. Flares at times. Seen in ER about 1 mos ago. Previous PCP was giving tramadol for pain. Taking occasionally but doesn't feel it helps. Taking flexeril PRN for pain- this helps the most. . ER gave her etodolac- this was not helpful but the lidocaine patch was very helpful. She needs a prior authorization completed for this medication. No numbness. No progressive weakness. No saddle anesthesia.   Health maintenance:  Works at [employer] Last pap: March 2016.  Contraception: Tubal ligation. Oral contraceptive pills to regulate periods- heavy uterine.  Non-smoker.    Past Medical History  Diagnosis Date  . Depression   . Anxiety   . Back pain    Social History   Social History  . Marital Status: Married    Spouse Name: N/A  . Number of Children: N/A  . Years of Education: N/A   Occupational History  . Not on file.   Social History Main Topics  . Smoking status: Never Smoker   . Smokeless tobacco: Never Used  . Alcohol Use: No  . Drug Use: No  . Sexual Activity: Yes   Other Topics Concern  . Not on file   Social History Narrative   Family History  Problem Relation Age of Onset  . Diabetes Mother   . Liver disease Mother   . Kidney disease Mother   . Hypertension Father    Current Outpatient Prescriptions on File Prior to Visit  Medication Sig  . buPROPion (WELLBUTRIN XL) 300 MG 24 hr tablet Take 300 mg by mouth daily.  . clonazePAM (KLONOPIN) 1 MG tablet Take 1 mg by mouth 2 (two) times daily as needed for anxiety.  Marland Kitchen. zolpidem (AMBIEN) 10 MG tablet Take 10 mg by mouth at bedtime as needed for sleep.   No current facility-administered medications on file prior to visit.    Review of Systems  Constitutional: Positive for fatigue. Negative for fever and chills.  HENT: Positive for trouble swallowing.   Respiratory: Negative for cough, chest  tightness and wheezing.   Cardiovascular: Negative for chest pain and leg swelling.  Gastrointestinal: Negative for nausea, vomiting, abdominal pain, diarrhea and constipation.  Endocrine: Negative.  Negative for cold intolerance, heat intolerance, polydipsia, polyphagia and polyuria.  Genitourinary: Negative for dysuria and difficulty urinating.  Musculoskeletal: Positive for back pain. Negative for gait problem, neck pain and neck stiffness.  Skin: Negative for color change, pallor and rash.  Neurological: Negative for dizziness, light-headedness  and numbness.  Psychiatric/Behavioral: Positive for sleep disturbance and dysphoric mood. Negative for suicidal ideas. The patient is nervous/anxious.    Per HPI unless specifically indicated above     Objective:    BP 104/72 mmHg  Pulse 85  Temp(Src) 97.8 F (36.6 C) (Oral)  Resp 16  Ht 4\' 11"  (1.499 m)  Wt 181 lb (82.101 kg)  BMI 36.54 kg/m2  LMP 01/28/2016  Wt Readings from Last 3 Encounters:  01/29/16 181 lb (82.101 kg)  12/03/15 165 lb (74.844 kg)  08/25/15 170 lb (77.111 kg)    Physical Exam  Constitutional: She is oriented to person, place, and time. She appears well-developed and well-nourished.  HENT:  Head: Normocephalic and atraumatic.  Neck: Neck supple.  Cardiovascular: Normal rate, regular rhythm and normal heart sounds.  Exam reveals no gallop and no friction rub.   No murmur heard. Pulmonary/Chest: Effort normal and breath sounds normal. She has no wheezes. She exhibits no tenderness.  Abdominal: Soft. Normal appearance and bowel sounds are normal. She exhibits no distension and no mass. There is no tenderness. There is no rebound and no guarding.  Musculoskeletal: She exhibits no edema or tenderness.       Thoracic back: She exhibits normal range of motion, no tenderness, no edema and no deformity.       Lumbar back: She exhibits decreased range of motion (due to pain). She exhibits no tenderness, no swelling and no edema.  Lymphadenopathy:    She has no cervical adenopathy.  Neurological: She is alert and oriented to person, place, and time.  Skin: Skin is warm and dry.  Psychiatric: She has a normal mood and affect. Her speech is normal and behavior is normal. Judgment and thought content normal. Cognition and memory are normal.   Results for orders placed or performed during the hospital encounter of 08/25/15  CBC  Result Value Ref Range   WBC 10.9 3.6 - 11.0 K/uL   RBC 4.53 3.80 - 5.20 MIL/uL   Hemoglobin 12.6 12.0 - 16.0 g/dL   HCT 11.9 14.7 - 82.9  %   MCV 82.4 80.0 - 100.0 fL   MCH 27.8 26.0 - 34.0 pg   MCHC 33.8 32.0 - 36.0 g/dL   RDW 56.2 13.0 - 86.5 %   Platelets 342 150 - 440 K/uL  Comprehensive metabolic panel  Result Value Ref Range   Sodium 138 135 - 145 mmol/L   Potassium 3.8 3.5 - 5.1 mmol/L   Chloride 108 101 - 111 mmol/L   CO2 24 22 - 32 mmol/L   Glucose, Bld 136 (H) 65 - 99 mg/dL   BUN 10 6 - 20 mg/dL   Creatinine, Ser 7.84 0.44 - 1.00 mg/dL   Calcium 9.0 8.9 - 69.6 mg/dL   Total Protein 7.4 6.5 - 8.1 g/dL   Albumin 3.4 (L) 3.5 - 5.0 g/dL   AST 19 15 - 41 U/L   ALT 13 (L) 14 - 54 U/L   Alkaline Phosphatase 48 38 - 126 U/L   Total Bilirubin <  0.1 (L) 0.3 - 1.2 mg/dL   GFR calc non Af Amer >60 >60 mL/min   GFR calc Af Amer >60 >60 mL/min   Anion gap 6 5 - 15  Troponin I  Result Value Ref Range   Troponin I <0.03 <0.031 ng/mL  Fibrin derivatives D-Dimer ([facility] only)  Result Value Ref Range   Fibrin derivatives D-dimer ([facility]) 502 (H) 0 - 499      Assessment & Plan:   Problem List Items Addressed This Visit      Endocrine   Enlarged thyroid - Primary    Check TSH and obtain updated [place] of head and neck. Recheck 1 mos.       Relevant Orders   TSH   Comprehensive metabolic panel   US Soft Tissue Head/Neck     Other   Clinical depression    Controlled with Wellbutrin. Discussed clonzepam- pt using PRN given life stressors at this time. Plan to use only when needed. Check TSH, B12, Vitamin D.       Relevant Orders   VITAMIN D 25 Hydroxy (Vit-D Deficiency, Fractures)   Midline low back pain    Renewed flexeril as needed. Discussed physical therapy as a option for pain management. Pt is not sure she has time right now. Have renewed lidocaine patches so prior authorization can be started. Feel this is a good option for pain control. Pt would like to continue to take tramadol PRN. Encouraged her not to take when driving or working. Recheck 1 mos.       Relevant Medications   cyclobenzaprine (FLEXERIL)  10 MG tablet   lidocaine (LIDODERM) 5 %    Other Visit Diagnoses    Paresthesia of both hands        At night. Check B12.    Relevant Orders    B12 and Folate Panel    Cold intolerance        Check CBC, TSH to determine source of symptoms.     Relevant Orders    CBC with Differential/Platelet    TSH    Mild hyperlipidemia        Relevant Orders    Lipid Profile       Meds ordered this encounter  Medications  . cyclobenzaprine (FLEXERIL) 10 MG tablet    Sig: Take 1 tablet (10 mg total) by mouth 3 (three) times daily as needed for muscle spasms.    Dispense:  30 tablet    Refill:  1    Order Specific Question:  Supervising Provider    Answer:  Janeann Forehand [phone]  . DISCONTD: lidocaine (LIDODERM) 5 %    Sig: Place 1 patch onto the skin daily. Remove & Discard patch within 12 hours or as directed by MD    Dispense:  15 patch    Refill:  3    Order Specific Question:  Supervising Provider    Answer:  Janeann Forehand [phone]  . lidocaine (LIDODERM) 5 %    Sig: Place 1 patch onto the skin daily. Remove & Discard patch within 12 hours or as directed by MD    Dispense:  15 patch    Refill:  3    Order Specific Question:  Supervising Provider    Answer:  Janeann Forehand [[id number]]      Follow up plan: Return in about 4 weeks (around 02/26/2016) for fatigue. Marland Kitchen

## 2016-01-29 NOTE — Assessment & Plan Note (Addendum)
Check TSH and obtain updated US of head and neck. Recheck 1 mos.

## 2016-01-29 NOTE — Assessment & Plan Note (Signed)
Controlled with Wellbutrin. Discussed clonzepam- pt using PRN given life stressors at this time. Plan to use only when needed. Check TSH, B12, Vitamin D.

## 2016-01-29 NOTE — Patient Instructions (Signed)
We will check some labs for your thyroid function and to determine the source of the your sleepiness.

## 2016-02-26 ENCOUNTER — Ambulatory Visit: Payer: Self-pay | Admitting: Family Medicine

## 2016-04-23 ENCOUNTER — Encounter: Payer: Self-pay | Admitting: Emergency Medicine

## 2016-04-23 ENCOUNTER — Emergency Department
Admission: EM | Admit: 2016-04-23 | Discharge: 2016-04-23 | Disposition: A | Discharge status: Home / Self Care | Payer: BLUE CROSS/BLUE SHIELD | Attending: Emergency Medicine | Admitting: Emergency Medicine

## 2016-04-23 ENCOUNTER — Emergency Department: Payer: BLUE CROSS/BLUE SHIELD

## 2016-04-23 DIAGNOSIS — R0789 Other chest pain: Secondary | ICD-10-CM | POA: Diagnosis not present

## 2016-04-23 DIAGNOSIS — R079 Chest pain, unspecified: Secondary | ICD-10-CM

## 2016-04-23 LAB — BASIC METABOLIC PANEL
ANION GAP: 10 (ref 5–15)
BUN: 12 mg/dL (ref 6–20)
CALCIUM: 9.1 mg/dL (ref 8.9–10.3)
CHLORIDE: 103 mmol/L (ref 101–111)
CO2: 24 mmol/L (ref 22–32)
Creatinine, Ser: 0.57 mg/dL (ref 0.44–1.00)
GFR calc Af Amer: >60 mL/min (ref >60)
GFR calc non Af Amer: >60 mL/min (ref >60)
GLUCOSE: 104 mg/dL — AB (ref 65–99)
Potassium: 3.8 mmol/L (ref 3.5–5.1)
Sodium: 137 mmol/L (ref 135–145)

## 2016-04-23 LAB — CBC
HCT: 38.4 % (ref 35.0–47.0)
Hemoglobin: 13.3 g/dL (ref 12.0–16.0)
MCH: 28.6 pg (ref 26.0–34.0)
MCHC: 34.7 g/dL (ref 32.0–36.0)
MCV: 82.5 fL (ref 80.0–100.0)
Platelets: 364 10*3/uL (ref 150–440)
RBC: 4.65 MIL/uL (ref 3.80–5.20)
RDW: 13.1 % (ref 11.5–14.5)
WBC: 13.4 10*3/uL — ABNORMAL HIGH (ref 3.6–11.0)

## 2016-04-23 LAB — TROPONIN I

## 2016-04-23 MED ORDER — LORAZEPAM 1 MG PO TABS
1.0000 mg | ORAL_TABLET | Freq: Once | ORAL | Status: AC
  Administered 2016-04-23: 1 mg via ORAL
  Filled 2016-04-23: qty 1

## 2016-04-23 MED ORDER — TRAMADOL HCL 50 MG PO TABS: 50.0000 mg | ORAL_TABLET | Freq: Four times a day (QID) | ORAL | 0 refills | Status: DC | PRN

## 2016-04-23 MED ORDER — LORAZEPAM 1 MG PO TABS: 1.0000 mg | ORAL_TABLET | Freq: Two times a day (BID) | ORAL | 0 refills | Status: AC | PRN

## 2016-04-23 NOTE — ED Triage Notes (Signed)
Patient ambulatory to triage with steady gait, without difficulty or distress noted; pt reports left sided CP radiating into back since last night; denies hx of same; denies accomp symptoms

## 2016-04-23 NOTE — ED Provider Notes (Signed)
Time Seen: Approximately 0 7:30  I have reviewed the triage notes  Chief Complaint: Chest Pain   History of Present Illness: Marie Sawyer is a 45 y.o. female who presents with onset of chest discomfort which started last night after she had an argument with her husband. She states she chronically suffers from some back pain, depression, and anxiety. Patient states she had the chest discomfort through most arrest tonight and describes it as a bubble sensation in the middle of her chest. Patient denies any associated symptoms such as nausea, vomiting, shortness of breath. She denies any radiation discomfort to the arm or jaw area. She states the back pain that she is having is chronic and mainly exacerbated by movement. She denies any pulmonary emboli risk factors, leg pain, swelling.   Past Medical History:  Diagnosis Date  . Anxiety   . Back pain   . Depression     Patient Active Problem List   Diagnosis Date Noted  . Clinical depression 01/29/2016  . Cephalalgia 01/29/2016  . Enlarged thyroid 01/29/2016  . Midline low back pain 01/29/2016    Past Surgical History:  Procedure Laterality Date  . CESAREAN SECTION    . TUBAL LIGATION      Past Surgical History:  Procedure Laterality Date  . CESAREAN SECTION    . TUBAL LIGATION      Current Outpatient Rx  . Order #: [id number] Class: Historical Med  . Order #: [id number] Class: Historical Med  . Order #: [id number] Class: No Print  . Order #: [id number] Class: Historical Med  . Order #: [id number] Class: Historical Med  . Order #: [id number] Class: Print  . Order #: [id number] Class: Print    Allergies:  Review of patient's allergies indicates no known allergies.  Family History: Family History  Problem Relation Age of Onset  . Diabetes Mother   . Liver disease Mother   . Kidney disease Mother   . Hypertension Father     Social History: Social History  Substance Use Topics  . Smoking status: Never Smoker  . Smokeless  tobacco: Never Used  . Alcohol use No     Review of Systems:   10 point review of systems was performed and was otherwise negative:  Constitutional: No fever Eyes: No visual disturbances ENT: No sore throat, ear pain Cardiac: Chest pain described above Respiratory: No shortness of breath, wheezing, or stridor Abdomen: No abdominal pain, no vomiting, No diarrhea Endocrine: No weight loss, No night sweats Extremities: No peripheral edema, cyanosis Skin: No rashes, easy bruising Neurologic: No focal weakness, trouble with speech or swollowing Urologic: No dysuria, Hematuria, or urinary frequency   Physical Exam:  ED Triage Vitals [04/23/16 0446]  Enc Vitals Group     BP 132/74     Pulse Rate 88     Resp 18     Temp 98.1 F (36.7 C)     Temp Source Oral     SpO2 100 %     Weight 170 lb (77.1 kg)     Height  (1.499 m)     Head Circumference      Peak Flow      Pain Score 7     Pain Loc      Pain Edu?      Excl. in GC?     General: Awake , Alert , and Oriented times 3; GCS 15 Head: Normal cephalic , atraumatic Eyes: Pupils equal , round, reactive to light Nose/Throat: No nasal drainage, patent upper  airway without erythema or exudate.  Neck: Supple, Full range of motion, No anterior adenopathy or palpable thyroid masses Lungs: Clear to ascultation without wheezes , rhonchi, or rales Heart: Regular rate, regular rhythm without murmurs , gallops , or rubs Abdomen: Soft, non tender without rebound, guarding , or rigidity; bowel sounds positive and symmetric in all 4 quadrants. No organomegaly .        Extremities: 2 plus symmetric pulses. No edema, clubbing or cyanosis Neurologic: normal ambulation, Motor symmetric without deficits, sensory intact Skin: warm, dry, no rashes   Labs:   All laboratory work was reviewed including any pertinent negatives or positives listed below:  Labs Reviewed  BASIC METABOLIC PANEL - Abnormal; Notable for the following:        Result Value   Glucose, Bld 104 (*)    All other components within normal limits  CBC - Abnormal; Notable for the following:    WBC 13.4 (*)    All other components within normal limits  TROPONIN I  Laboratory work was reviewed and showed no clinically significant abnormalities.   EKG: ED ECG REPORT I, Jennye Moccasin, the attending physician, personally viewed and interpreted this ECG.  Date: 04/23/2016 EKG Time: 0453 Rate: 84 Rhythm: normal sinus rhythm QRS Axis: normal Intervals: normal ST/T Wave abnormalities: normal Conduction Disturbances: none Narrative Interpretation: unremarkable Normal EKG   Radiology:   Negative chest x-ray  I personally reviewed the radiologic studies    ED Course:  Differential includes all life-threatening causes for chest pain. This includes but is not exclusive to acute coronary syndrome, aortic dissection, pulmonary embolism, cardiac tamponade, community-acquired pneumonia, pericarditis, musculoskeletal chest wall pain, etc. Based on the patient's clinical presentation and objective findings I felt this was unlikely to be a life-threatening cause for chest pain and most likely related to anxiety, possible esophageal reflux disease. Patient was given Ativan with symptomatic improvement and will be discharged on pain medication along with Ativan Clinical Course     Assessment: Acute unspecified chest pain Anxiety      Plan:  Outpatient Ativan, tramadol Patient was advised to return immediately if condition worsens. Patient was advised to follow up with their primary care physician or other specialized physicians involved in their outpatient care. The patient and/or family member/power of attorney had laboratory results reviewed at the bedside. All questions and concerns were addressed and appropriate discharge instructions were distributed by the nursing staff.            Jennye Moccasin, MD 04/23/16 [phone]

## 2016-06-24 ENCOUNTER — Emergency Department: Payer: BLUE CROSS/BLUE SHIELD

## 2016-06-24 DIAGNOSIS — S39012A Strain of muscle, fascia and tendon of lower back, initial encounter: Secondary | ICD-10-CM | POA: Diagnosis not present

## 2016-06-24 DIAGNOSIS — R0789 Other chest pain: Secondary | ICD-10-CM | POA: Insufficient documentation

## 2016-06-24 DIAGNOSIS — Y9389 Activity, other specified: Secondary | ICD-10-CM | POA: Insufficient documentation

## 2016-06-24 DIAGNOSIS — Y929 Unspecified place or not applicable: Secondary | ICD-10-CM | POA: Diagnosis not present

## 2016-06-24 DIAGNOSIS — Y990 Civilian activity done for income or pay: Secondary | ICD-10-CM | POA: Insufficient documentation

## 2016-06-24 DIAGNOSIS — X58XXXA Exposure to other specified factors, initial encounter: Secondary | ICD-10-CM | POA: Insufficient documentation

## 2016-06-24 DIAGNOSIS — M549 Dorsalgia, unspecified: Secondary | ICD-10-CM | POA: Diagnosis present

## 2016-06-24 LAB — BASIC METABOLIC PANEL
Anion gap: 10 (ref 5–15)
BUN: 14 mg/dL (ref 6–20)
CHLORIDE: 104 mmol/L (ref 101–111)
CO2: 22 mmol/L (ref 22–32)
CREATININE: 0.72 mg/dL (ref 0.44–1.00)
Calcium: 9.3 mg/dL (ref 8.9–10.3)
GFR calc Af Amer: >60 mL/min (ref >60)
GFR calc non Af Amer: >60 mL/min (ref >60)
GLUCOSE: 129 mg/dL — AB (ref 65–99)
POTASSIUM: 3.7 mmol/L (ref 3.5–5.1)
Sodium: 136 mmol/L (ref 135–145)

## 2016-06-24 LAB — CBC
HCT: 41.6 % (ref 35.0–47.0)
HEMOGLOBIN: 14.5 g/dL (ref 12.0–16.0)
MCH: 28.4 pg (ref 26.0–34.0)
MCHC: 34.8 g/dL (ref 32.0–36.0)
MCV: 81.6 fL (ref 80.0–100.0)
Platelets: 377 10*3/uL (ref 150–440)
RBC: 5.11 MIL/uL (ref 3.80–5.20)
RDW: 13.3 % (ref 11.5–14.5)
WBC: 11.4 10*3/uL — ABNORMAL HIGH (ref 3.6–11.0)

## 2016-06-24 LAB — TROPONIN I: Troponin I: <0.03 ng/mL (ref <0.03)

## 2016-06-24 NOTE — ED Triage Notes (Signed)
Patient ambulatory to triage with steady gait, without difficulty or distress noted; pt reports upper back pain radiating into upper chest; st hx of same but unsure of dx; denies any accomp symptoms

## 2016-06-25 ENCOUNTER — Emergency Department
Admission: EM | Admit: 2016-06-25 | Discharge: 2016-06-25 | Disposition: A | Discharge status: Home / Self Care | Payer: BLUE CROSS/BLUE SHIELD | Attending: Emergency Medicine | Admitting: Emergency Medicine

## 2016-06-25 DIAGNOSIS — T148XXA Other injury of unspecified body region, initial encounter: Secondary | ICD-10-CM

## 2016-06-25 DIAGNOSIS — R0789 Other chest pain: Secondary | ICD-10-CM

## 2016-06-25 MED ORDER — ASPIRIN 81 MG PO CHEW
324.0000 mg | CHEWABLE_TABLET | Freq: Once | ORAL | Status: AC
  Administered 2016-06-25: 324 mg via ORAL

## 2016-06-25 MED ORDER — ASPIRIN 81 MG PO CHEW
CHEWABLE_TABLET | ORAL | Status: AC
  Administered 2016-06-25: 324 mg via ORAL
  Filled 2016-06-25: qty 4

## 2016-06-25 NOTE — ED Provider Notes (Signed)
[facility] Emergency Department Provider Note  ____________________________________________   First MD Initiated Contact with Patient 06/25/16 [phone]     (approximate)  I have reviewed the triage vital signs and the nursing notes.   HISTORY  Chief Complaint Back Pain and Chest Pain    HPI Marie Sawyer is a 45 y.o. female with a history of anxiety, back pain, and depression who presents for evaluation of vague symptoms of back pain radiating through to her chest.  This happens intermittently and chronically and she has been seen previously for the same symptoms, about 2 months ago in this emergency department.  She says that it is related to working long overnight hours where she has to walk extensively.  It is not made worse by eating or drinking.  She denies shortness of breath, fever, chills, abdominal pain, nausea, vomiting, diarrhea, dysuria.  She describes it as an aching pain that is moderate in intensity and is worse but currently mild.  She reports that she came in tonight because she called out of work due to the chest and back pain but they told her that she needs to go to the doctor and get a note.  She states that she has been worked up recently by her outpatient doctor including abdominal ultrasound make sure she did not have gallstones and that it was normal.   Past Medical History:  Diagnosis Date  . Anxiety   . Back pain   . Depression     Patient Active Problem List   Diagnosis Date Noted  . Clinical depression 01/29/2016  . Cephalalgia 01/29/2016  . Enlarged thyroid 01/29/2016  . Midline low back pain 01/29/2016    Past Surgical History:  Procedure Laterality Date  . CESAREAN SECTION    . TUBAL LIGATION      Prior to Admission medications   Medication Sig Start Date End Date Taking? Authorizing Provider  buPROPion (WELLBUTRIN XL) 300 MG 24 hr tablet Take 300 mg by mouth daily.    Historical Provider, MD  clonazePAM  (KLONOPIN) 1 MG tablet Take 1 mg by mouth 2 (two) times daily as needed for anxiety.    Historical Provider, MD  cyclobenzaprine (FLEXERIL) 10 MG tablet Take 1 tablet (10 mg total) by mouth 3 (three) times daily as needed for muscle spasms. 01/29/16   Amy Rusty Aus, NP  ibuprofen (ADVIL,MOTRIN) 200 MG tablet Take 200 mg by mouth every 6 (six) hours as needed.    Historical Provider, MD  traMADol (ULTRAM) 50 MG tablet Take 1 tablet (50 mg total) by mouth every 6 (six) hours as needed. 04/23/16   Jennye Moccasin, MD  zolpidem (AMBIEN) 10 MG tablet Take 10 mg by mouth at bedtime as needed for sleep.    Historical Provider, MD    Allergies Review of patient's allergies indicates no known allergies.  Family History  Problem Relation Age of Onset  . Diabetes Mother   . Liver disease Mother   . Kidney disease Mother   . Hypertension Father     Social History Social History  Substance Use Topics  . Smoking status: Never Smoker  . Smokeless tobacco: Never Used  . Alcohol use No    Review of Systems Constitutional: No fever/chills Eyes: No visual changes. ENT: No sore throat. Cardiovascular: Mild aching pain in back radiating to chest Respiratory: Denies shortness of breath. Gastrointestinal: No abdominal pain.  No nausea, no vomiting.  No diarrhea.  No constipation. Genitourinary: Negative for  dysuria. Musculoskeletal: +back pain. Skin: Negative for rash. Neurological: Negative for headaches, focal weakness or numbness.  10-point ROS otherwise negative.  ____________________________________________   PHYSICAL EXAM:  VITAL SIGNS: ED Triage Vitals  Enc Vitals Group     BP 06/24/16 2210 124/68     Pulse Rate 06/24/16 2210 79     Resp 06/24/16 2210 16     Temp --      Temp src --      SpO2 06/24/16 2210 100 %     Weight 06/24/16 2200 170 lb (77.1 kg)     Height 06/24/16 2200 4\' 11"  (1.499 m)     Head Circumference --      Peak Flow --      Pain Score 06/24/16 2200 6      Pain Loc --      Pain Edu? --      Excl. in GC? --     Constitutional: Alert and oriented. Well appearing and in no acute distress. Eyes: Conjunctivae are normal. PERRL. EOMI. Head: Atraumatic. Nose: No congestion/rhinnorhea. Mouth/Throat: Mucous membranes are moist.  Oropharynx non-erythematous. Neck: No stridor.  No meningeal signs.   Cardiovascular: Normal rate, regular rhythm. Good peripheral circulation. Grossly normal heart sounds. Mildly reproducible chest wall tenderness. Respiratory: Normal respiratory effort.  No retractions. Lungs CTAB. Gastrointestinal: Soft and nontender. No distention.  Musculoskeletal: No lower extremity tenderness nor edema. No gross deformities of extremities. Neurologic:  Normal speech and language. No gross focal neurologic deficits are appreciated.  Skin:  Skin is warm, dry and intact. No rash noted. Psychiatric: Mood and affect are normal. Speech and behavior are normal.  ____________________________________________   LABS (all labs ordered are listed, but only abnormal results are displayed)  Labs Reviewed  CBC - Abnormal; Notable for the following:       Result Value   WBC 11.4 (*)    All other components within normal limits  BASIC METABOLIC PANEL - Abnormal; Notable for the following:    Glucose, Bld 129 (*)    All other components within normal limits  TROPONIN I   ____________________________________________  EKG  ED ECG REPORT I, Maribell Demeo, the attending physician, personally viewed and interpreted this ECG.  Date: 06/24/2016 EKG Time: 22:14 Rate: 83 Rhythm: normal sinus rhythm QRS Axis: normal Intervals: normal ST/T Wave abnormalities: normal Conduction Disturbances: none Narrative Interpretation: unremarkable  ____________________________________________  RADIOLOGY   Dg Chest 2 View  Result Date: 06/24/2016 CLINICAL DATA:  45 year old female with chest pain EXAM: CHEST  2 VIEW COMPARISON:  Chest radiograph  dated 04/23/2016 FINDINGS: The heart size and mediastinal contours are within normal limits. Both lungs are clear. The visualized skeletal structures are unremarkable. IMPRESSION: No active cardiopulmonary disease. Electronically Signed   By: Elgie CollardArash  Radparvar M.D.   On: 06/24/2016 23:08    ____________________________________________   PROCEDURES  Procedure(s) performed:   Procedures   Critical Care performed: No ____________________________________________   INITIAL IMPRESSION / ASSESSMENT AND PLAN / ED COURSE  Pertinent labs & imaging results that were available during my care of the patient were reviewed by me and considered in my medical decision making (see chart for details).  PERC negative, HEART score zero.  Musculoskeletal pain most likely.  The patient states she would not even have come and of her workup did not require it.  I offered an ultrasound to evaluate for possible gallbladder disease but she declines.  I gave my usual and customary return precautions.  Gave full dose aspirin  prior to discharge  ____________________________________________  FINAL CLINICAL IMPRESSION(S) / ED DIAGNOSES  Final diagnoses:  Muscle strain  Atypical chest pain     MEDICATIONS GIVEN DURING THIS VISIT:  Medications  aspirin chewable tablet 324 mg (324 mg Oral Given 06/25/16 0230)     NEW OUTPATIENT MEDICATIONS STARTED DURING THIS VISIT:  New Prescriptions   No medications on file    Modified Medications   No medications on file    Discontinued Medications   No medications on file     Note:  This document was prepared using Dragon voice recognition software and may include unintentional dictation errors.    Loleta Rose, MD 06/25/16 [phone]

## 2016-07-09 ENCOUNTER — Emergency Department
Admission: EM | Admit: 2016-07-09 | Discharge: 2016-07-09 | Disposition: A | Discharge status: Home / Self Care | Payer: BLUE CROSS/BLUE SHIELD | Attending: Emergency Medicine | Admitting: Emergency Medicine

## 2016-07-09 ENCOUNTER — Encounter: Payer: Self-pay | Admitting: Emergency Medicine

## 2016-07-09 DIAGNOSIS — M7541 Impingement syndrome of right shoulder: Secondary | ICD-10-CM | POA: Insufficient documentation

## 2016-07-09 DIAGNOSIS — M25511 Pain in right shoulder: Secondary | ICD-10-CM | POA: Diagnosis present

## 2016-07-09 DIAGNOSIS — Z791 Long term (current) use of non-steroidal anti-inflammatories (NSAID): Secondary | ICD-10-CM | POA: Insufficient documentation

## 2016-07-09 DIAGNOSIS — Z79899 Other long term (current) drug therapy: Secondary | ICD-10-CM | POA: Insufficient documentation

## 2016-07-09 MED ORDER — MELOXICAM 15 MG PO TABS: 15.0000 mg | ORAL_TABLET | Freq: Every day | ORAL | 0 refills | Status: DC

## 2016-07-09 NOTE — ED Triage Notes (Signed)
Pt to ED with c/o of right shoulder pain that started Sunday and has continued to increase. Pt denies injury.

## 2016-07-09 NOTE — ED Provider Notes (Signed)
[facility] Emergency Department Provider Note  ____________________________________________  Time seen: Approximately 4:36 PM  I have reviewed the triage vital signs and the nursing notes.   HISTORY  Chief Complaint Shoulder Pain    HPI Marie Sawyer is a 45 y.o. female who presents emergency department complaining of a three-day history of right shoulder pain. Patient states that she initially felt the pain while sleeping on that shoulder. Patient states that the pain has increased in intensity since that time. She has tried tramadol and muscle relaxer with no relief. Patient states that the pain is located on the anterior aspect of her shoulder. No direct trauma to the area. No neck pain. No chest pain. No shortness of breath, nausea or vomiting. 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   Past Medical History:  Diagnosis Date  . Anxiety   . Back pain   . Depression     Patient Active Problem List   Diagnosis Date Noted  . Clinical depression 01/29/2016  . Cephalalgia 01/29/2016  . Enlarged thyroid 01/29/2016  . Midline low back pain 01/29/2016    Past Surgical History:  Procedure Laterality Date  . CESAREAN SECTION    . TUBAL LIGATION      Prior to Admission medications   Medication Sig Start Date End Date Taking? Authorizing Provider  buPROPion (WELLBUTRIN XL) 300 MG 24 hr tablet Take 300 mg by mouth daily.    Historical Provider, MD  clonazePAM (KLONOPIN) 1 MG tablet Take 1 mg by mouth 2 (two) times daily as needed for anxiety.    Historical Provider, MD  cyclobenzaprine (FLEXERIL) 10 MG tablet Take 1 tablet (10 mg total) by mouth 3 (three) times daily as needed for muscle spasms. 01/29/16   Amy Rusty Aus, NP  ibuprofen (ADVIL,MOTRIN) 200 MG tablet Take 200 mg by mouth every 6 (six) hours as needed.    Historical Provider, MD  meloxicam (MOBIC) 15 MG  tablet Take 1 tablet (15 mg total) by mouth daily. 07/09/16   Delorise Royals Hershell Brandl, PA-C  traMADol (ULTRAM) 50 MG tablet Take 1 tablet (50 mg total) by mouth every 6 (six) hours as needed. 04/23/16   Jennye Moccasin, MD  zolpidem (AMBIEN) 10 MG tablet Take 10 mg by mouth at bedtime as needed for sleep.    Historical Provider, MD    Allergies Review of patient's allergies indicates no known allergies.  Family History  Problem Relation Age of Onset  . Diabetes Mother   . Liver disease Mother   . Kidney disease Mother   . Hypertension Father     Social History Social History  Substance Use Topics  . Smoking status: Never Smoker  . Smokeless tobacco: Never Used  . Alcohol use No     Review of Systems  Constitutional: No fever/chills Cardiovascular: no chest pain. Respiratory: no cough. No SOB. Gastrointestinal: No abdominal pain.  No nausea, no vomiting.  No diarrhea.  No constipation. Musculoskeletal: Positive for right shoulder pain Skin: Negative for rash, abrasions, lacerations, ecchymosis. Neurological: Negative for headaches, focal weakness or numbness. 10-point ROS otherwise negative.  ____________________________________________   PHYSICAL EXAM:  VITAL SIGNS: ED Triage Vitals  Enc Vitals Group     BP 07/09/16 1619 137/81     Pulse Rate 07/09/16 1619 95     Resp 07/09/16 1619 18     Temp 07/09/16 1619 98.7 F (37.1 C)  Temp Source 07/09/16 1619 Oral     SpO2 07/09/16 1619 98 %     Weight 07/09/16 1620 165 lb (74.8 kg)     Height 07/09/16 1620 4\' 11"  (1.499 m)     Head Circumference --      Peak Flow --      Pain Score 07/09/16 1622 10     Pain Loc --      Pain Edu? --      Excl. in GC? --      Constitutional: Alert and oriented. Well appearing and in no acute distress. Eyes: Conjunctivae are normal. PERRL. EOMI. Head: Atraumatic. Neck: No stridor.  No cervical spine tenderness to palpation.  Cardiovascular: Normal rate, regular rhythm. Normal S1  and S2.  Good peripheral circulation. Respiratory: Normal respiratory effort without tachypnea or retractions. Lungs CTAB. Good air entry to the bases with no decreased or absent breath sounds. Musculoskeletal: No deformities or edema noted to right shoulder. Inspection. Limited range of motion due to pain. Patient is very tender to palpation over the [facility]. Positive Neer's test. No tenderness to palpation. No palpable abnormality. Radial pulses intact distally. Sensation intact and equal to the unaffected extremity.  Neurologic:  Normal speech and language. No gross focal neurologic deficits are appreciated.  Skin:  Skin is warm, dry and intact. No rash noted. Psychiatric: Mood and affect are normal. Speech and behavior are normal. Patient exhibits appropriate insight and judgement.   ____________________________________________   LABS (all labs ordered are listed, but only abnormal results are displayed)  Labs Reviewed - No data to display ____________________________________________  EKG   ____________________________________________  RADIOLOGY   No results found.  ____________________________________________    PROCEDURES  Procedure(s) performed:    Procedures    Medications - No data to display   ____________________________________________   INITIAL IMPRESSION / ASSESSMENT AND PLAN / ED COURSE  Pertinent labs & imaging results that were available during my care of the patient were reviewed by me and considered in my medical decision making (see chart for details).  Review of the [place] CSRS was performed in accordance of the [facility] prior to dispensing any controlled drugs.  Clinical Course    Patient's diagnosis is consistent with Impingement syndrome of the right shoulder. There is no direct trauma and no indication for imaging at this time. Patient will be placed on anti-inflammatories for symptom control. She may continue to take her prescribed tramadol as  necessary. Patient will follow-up with orthopedics should symptoms persist or worsen..  Patient is given ED precautions to return to the ED for any worsening or new symptoms.     ____________________________________________  FINAL CLINICAL IMPRESSION(S) / ED DIAGNOSES  Final diagnoses:  Impingement syndrome, shoulder, right      NEW MEDICATIONS STARTED DURING THIS VISIT:  New Prescriptions   MELOXICAM (MOBIC) 15 MG TABLET    Take 1 tablet (15 mg total) by mouth daily.        This chart was dictated using voice recognition software/Dragon. Despite best efforts to proofread, errors can occur which can change the meaning. Any change was purely unintentional.    Racheal PatchesJonathan D Dazja Houchin, PA-C 07/09/16 1646    Nita Sicklearolina Veronese, MD 07/10/16 [id number]

## 2016-07-09 NOTE — ED Notes (Signed)
Pt verbalized understanding of discharge instructions. NAD at this time. 

## 2016-07-11 ENCOUNTER — Encounter: Payer: Self-pay | Admitting: Emergency Medicine

## 2016-07-11 ENCOUNTER — Emergency Department: Payer: BLUE CROSS/BLUE SHIELD

## 2016-07-11 ENCOUNTER — Emergency Department
Admission: EM | Admit: 2016-07-11 | Discharge: 2016-07-11 | Disposition: A | Discharge status: Home / Self Care | Payer: BLUE CROSS/BLUE SHIELD | Attending: Emergency Medicine | Admitting: Emergency Medicine

## 2016-07-11 DIAGNOSIS — M25511 Pain in right shoulder: Secondary | ICD-10-CM | POA: Diagnosis present

## 2016-07-11 DIAGNOSIS — Z79899 Other long term (current) drug therapy: Secondary | ICD-10-CM | POA: Insufficient documentation

## 2016-07-11 MED ORDER — OXYCODONE-ACETAMINOPHEN 5-325 MG PO TABS: 1.0000 | ORAL_TABLET | ORAL | 0 refills | Status: DC | PRN

## 2016-07-11 MED ORDER — KETOROLAC TROMETHAMINE 60 MG/2ML IM SOLN
INTRAMUSCULAR | Status: AC
  Administered 2016-07-11: 60 mg via INTRAMUSCULAR
  Filled 2016-07-11: qty 2

## 2016-07-11 MED ORDER — KETOROLAC TROMETHAMINE 60 MG/2ML IM SOLN
60.0000 mg | Freq: Once | INTRAMUSCULAR | Status: AC
  Administered 2016-07-11: 60 mg via INTRAMUSCULAR

## 2016-07-11 MED ORDER — PREDNISONE 20 MG PO TABS: 60.0000 mg | ORAL_TABLET | Freq: Every day | ORAL | 0 refills | Status: AC

## 2016-07-11 MED ORDER — PREDNISONE 20 MG PO TABS
60.0000 mg | ORAL_TABLET | Freq: Once | ORAL | Status: AC
  Administered 2016-07-11: 60 mg via ORAL
  Filled 2016-07-11: qty 3

## 2016-07-11 NOTE — ED Provider Notes (Signed)
[facility] Emergency Department Provider Note    First MD Initiated Contact with Patient 07/11/16 0500     (approximate)  I have reviewed the triage vital signs and the nursing notes.   HISTORY  Chief Complaint Shoulder Pain    HPI Marie Sawyer is a 45 y.o. female presents to the emergency department with a four-day history of nontraumatic right shoulder pain. Patient states she noted a plane after awakening from sleeping. Patient states current pain score is 9 out of 10 worse with movement unrelieved with medications that she was prescribed on previous emergency department visit on 07/09/2016.  Past Medical History:  Diagnosis Date  . Anxiety   . Back pain   . Depression     Patient Active Problem List   Diagnosis Date Noted  . Clinical depression 01/29/2016  . Cephalalgia 01/29/2016  . Enlarged thyroid 01/29/2016  . Midline low back pain 01/29/2016    Past Surgical History:  Procedure Laterality Date  . CESAREAN SECTION    . TUBAL LIGATION      Prior to Admission medications   Medication Sig Start Date End Date Taking? Authorizing Provider  buPROPion (WELLBUTRIN XL) 300 MG 24 hr tablet Take 300 mg by mouth daily.    Historical Provider, MD  clonazePAM (KLONOPIN) 1 MG tablet Take 1 mg by mouth 2 (two) times daily as needed for anxiety.    Historical Provider, MD  cyclobenzaprine (FLEXERIL) 10 MG tablet Take 1 tablet (10 mg total) by mouth 3 (three) times daily as needed for muscle spasms. 01/29/16   Amy Rusty AusLauren Krebs, NP  ibuprofen (ADVIL,MOTRIN) 200 MG tablet Take 200 mg by mouth every 6 (six) hours as needed.    Historical Provider, MD  meloxicam (MOBIC) 15 MG tablet Take 1 tablet (15 mg total) by mouth daily. 07/09/16   Delorise RoyalsJonathan D Cuthriell, PA-C  oxyCODONE-acetaminophen (ROXICET) 5-325 MG tablet Take 1 tablet by mouth every 4 (four) hours as needed for severe pain. 07/11/16   Darci Currentandolph N Brandace Cargle, MD  predniSONE (DELTASONE) 20 MG tablet  Take 3 tablets (60 mg total) by mouth daily. 07/11/16 07/16/16  Darci Currentandolph N Kierston Plasencia, MD  traMADol (ULTRAM) 50 MG tablet Take 1 tablet (50 mg total) by mouth every 6 (six) hours as needed. 04/23/16   Jennye MoccasinBrian S Quigley, MD  zolpidem (AMBIEN) 10 MG tablet Take 10 mg by mouth at bedtime as needed for sleep.    Historical Provider, MD    Allergies No known drug allergies  Family History  Problem Relation Age of Onset  . Diabetes Mother   . Liver disease Mother   . Kidney disease Mother   . Hypertension Father     Social History Social History  Substance Use Topics  . Smoking status: Never Smoker  . Smokeless tobacco: Never Used  . Alcohol use No    Review of Systems Constitutional: No fever/chills Eyes: No visual changes. ENT: No sore throat. Cardiovascular: Denies chest pain. Respiratory: Denies shortness of breath. Gastrointestinal: No abdominal pain.  No nausea, no vomiting.  No diarrhea.  No constipation. Genitourinary: Negative for dysuria. Musculoskeletal: Negative for back pain.Positive for right shoulder pain Skin: Negative for rash. Neurological: Negative for headaches, focal weakness or numbness.  10-point ROS otherwise negative.  ____________________________________________   PHYSICAL EXAM:  VITAL SIGNS: ED Triage Vitals  Enc Vitals Group     BP 07/11/16 0438 136/87     Pulse Rate 07/11/16 0438 94     Resp 07/11/16 0438  20     Temp 07/11/16 0438 98.1 F (36.7 C)     Temp Source 07/11/16 0438 Oral     SpO2 07/11/16 0438 99 %     Weight 07/11/16 0437 165 lb (74.8 kg)     Height 07/11/16 0437 4\' 11"  (1.499 m)     Head Circumference --      Peak Flow --      Pain Score 07/11/16 0437 9     Pain Loc --      Pain Edu? --      Excl. in GC? --     Constitutional: Alert and oriented. Well appearing and in no acute distress. Eyes: Conjunctivae are normal. PERRL. EOMI. Head: Atraumatic. Ears:  Healthy appearing ear canals and TMs bilaterally Nose: No  congestion/rhinnorhea. Mouth/Throat: Mucous membranes are moist.  Oropharynx non-erythematous. Neck: No stridor.  No meningeal signs.  No cervical spine tenderness to palpation. Cardiovascular: Normal rate, regular rhythm. Good peripheral circulation. Grossly normal heart sounds. Respiratory: Normal respiratory effort.  No retractions. Lungs CTAB. Gastrointestinal: Soft and nontender. No distention.  Musculoskeletal: Pain with palpation of the right shoulder. Pain with passive and active range of motion Neurologic:  Normal speech and language. No gross focal neurologic deficits are appreciated.  Skin:  Skin is warm, dry and intact. No rash noted. Psychiatric: Mood and affect are normal. Speech and behavior are normal.   RADIOLOGY I, [place] N Kasiyah Platter, personally viewed and evaluated these images (plain radiographs) as part of my medical decision making, as well as reviewing the written report by the radiologist.  Dg Shoulder Right  Result Date: 07/11/2016 CLINICAL DATA:  Increasing right shoulder pain. EXAM: RIGHT SHOULDER - 2+ VIEW COMPARISON:  06/24/2016. FINDINGS: Prominent calcific density noted over the right supraspinatus region consistent with calcific supraspinatus tendinosis. No acute bony abnormality identified. No evidence of fracture dislocation. IMPRESSION: Prominent calcific density noted over the right supraspinatus region consistent with calcific supraspinatus tendinosis. No acute bony or joint abnormality. Electronically Signed   By: Maisie Fus  Register   On: 07/11/2016 06:58     Procedures      INITIAL IMPRESSION / ASSESSMENT AND PLAN / ED COURSE  Pertinent labs & imaging results that were available during my care of the patient were reviewed by me and considered in my medical decision making (see chart for details).  Patient given Toradol 60 mg IM shot with prednisone emergency department improvement of pain.   Clinical Course     ____________________________________________  FINAL CLINICAL IMPRESSION(S) / ED DIAGNOSES  Final diagnoses:  Acute pain of right shoulder     MEDICATIONS GIVEN DURING THIS VISIT:  Medications  ketorolac (TORADOL) injection 60 mg (60 mg Intramuscular Given 07/11/16 0552)  predniSONE (DELTASONE) tablet 60 mg (60 mg Oral Given 07/11/16 0601)     NEW OUTPATIENT MEDICATIONS STARTED DURING THIS VISIT:  New Prescriptions   OXYCODONE-ACETAMINOPHEN (ROXICET) 5-325 MG TABLET    Take 1 tablet by mouth every 4 (four) hours as needed for severe pain.   PREDNISONE (DELTASONE) 20 MG TABLET    Take 3 tablets (60 mg total) by mouth daily.    Modified Medications   No medications on file    Discontinued Medications   No medications on file     Note:  This document was prepared using Dragon voice recognition software and may include unintentional dictation errors.    Darci Current, MD 07/12/16 [phone]

## 2016-07-11 NOTE — ED Triage Notes (Signed)
Patient ambulatory to triage with steady gait, without difficulty or distress noted; pt reports right shoulder pain since Sunday with no known injury; seen here for same 2 days ago and rx meds without relief; st dx with impingement of the shoulder

## 2016-07-18 ENCOUNTER — Encounter: Payer: Self-pay | Admitting: Emergency Medicine

## 2016-07-18 ENCOUNTER — Emergency Department
Admission: EM | Admit: 2016-07-18 | Discharge: 2016-07-18 | Disposition: A | Discharge status: Home / Self Care | Payer: BLUE CROSS/BLUE SHIELD | Attending: Emergency Medicine | Admitting: Emergency Medicine

## 2016-07-18 DIAGNOSIS — M6780 Other specified disorders of synovium and tendon, unspecified site: Secondary | ICD-10-CM

## 2016-07-18 DIAGNOSIS — Z79899 Other long term (current) drug therapy: Secondary | ICD-10-CM | POA: Insufficient documentation

## 2016-07-18 DIAGNOSIS — M7581 Other shoulder lesions, right shoulder: Secondary | ICD-10-CM | POA: Diagnosis not present

## 2016-07-18 DIAGNOSIS — M25511 Pain in right shoulder: Secondary | ICD-10-CM

## 2016-07-18 DIAGNOSIS — Z791 Long term (current) use of non-steroidal anti-inflammatories (NSAID): Secondary | ICD-10-CM | POA: Diagnosis not present

## 2016-07-18 MED ORDER — KETOROLAC TROMETHAMINE 60 MG/2ML IM SOLN
60.0000 mg | Freq: Once | INTRAMUSCULAR | Status: AC
  Administered 2016-07-18: 60 mg via INTRAMUSCULAR
  Filled 2016-07-18: qty 2

## 2016-07-18 MED ORDER — LIDOCAINE 5 % EX PTCH
1.0000 | MEDICATED_PATCH | CUTANEOUS | Status: DC
  Administered 2016-07-18: 1 via TRANSDERMAL
  Filled 2016-07-18: qty 1

## 2016-07-18 MED ORDER — LIDOCAINE 5 % EX PTCH: 1.0000 | MEDICATED_PATCH | Freq: Two times a day (BID) | CUTANEOUS | 0 refills | Status: AC

## 2016-07-18 NOTE — ED Notes (Signed)
Pt noted sleeping soundly in lobby

## 2016-07-18 NOTE — ED Provider Notes (Signed)
[facility] Emergency Department Provider Note   First MD Initiated Contact with Patient 07/18/16 0149     (approximate)  I have reviewed the triage vital signs and the nursing notes.   HISTORY  Chief Complaint Shoulder Pain   HPI Marie Sawyer is a 45 y.o. female intensity emergency department with complaint of right shoulder pain stating that she "slept on it wrong again". Patient currently admits to 10 out of 10 right shoulder pain worse with movement.Patient states that she has an appointment scheduled with the orthopedist on 11/3 2017.   Past Medical History:  Diagnosis Date  . Anxiety   . Back pain   . Depression     Patient Active Problem List   Diagnosis Date Noted  . Clinical depression 01/29/2016  . Cephalalgia 01/29/2016  . Enlarged thyroid 01/29/2016  . Midline low back pain 01/29/2016    Past Surgical History:  Procedure Laterality Date  . CESAREAN SECTION    . TUBAL LIGATION      Prior to Admission medications   Medication Sig Start Date End Date Taking? Authorizing Provider  buPROPion (WELLBUTRIN XL) 300 MG 24 hr tablet Take 300 mg by mouth daily.    Historical Provider, MD  clonazePAM (KLONOPIN) 1 MG tablet Take 1 mg by mouth 2 (two) times daily as needed for anxiety.    Historical Provider, MD  cyclobenzaprine (FLEXERIL) 10 MG tablet Take 1 tablet (10 mg total) by mouth 3 (three) times daily as needed for muscle spasms. 01/29/16   Amy Rusty AusLauren Krebs, NP  ibuprofen (ADVIL,MOTRIN) 200 MG tablet Take 200 mg by mouth every 6 (six) hours as needed.    Historical Provider, MD  meloxicam (MOBIC) 15 MG tablet Take 1 tablet (15 mg total) by mouth daily. 07/09/16   Delorise RoyalsJonathan D Cuthriell, PA-C  oxyCODONE-acetaminophen (ROXICET) 5-325 MG tablet Take 1 tablet by mouth every 4 (four) hours as needed for severe pain. 07/11/16   Darci Currentandolph N Brown, MD  traMADol (ULTRAM) 50 MG tablet Take 1 tablet (50 mg total) by mouth every 6 (six) hours as  needed. 04/23/16   Jennye MoccasinBrian S Quigley, MD  zolpidem (AMBIEN) 10 MG tablet Take 10 mg by mouth at bedtime as needed for sleep.    Historical Provider, MD    Allergies Review of patient's allergies indicates no known allergies.  Family History  Problem Relation Age of Onset  . Diabetes Mother   . Liver disease Mother   . Kidney disease Mother   . Hypertension Father     Social History Social History  Substance Use Topics  . Smoking status: Never Smoker  . Smokeless tobacco: Never Used  . Alcohol use No    Review of Systems Constitutional: No fever/chills Eyes: No visual changes. ENT: No sore throat. Cardiovascular: Denies chest pain. Respiratory: Denies shortness of breath. Gastrointestinal: No abdominal pain.  No nausea, no vomiting.  No diarrhea.  No constipation. Genitourinary: Negative for dysuria. Musculoskeletal: Negative for back pain.Positive for right shoulder pain Skin: Negative for rash. Neurological: Negative for headaches, focal weakness or numbness.  10-point ROS otherwise negative.  ____________________________________________   PHYSICAL EXAM:  VITAL SIGNS: ED Triage Vitals  Enc Vitals Group     BP 07/18/16 0023 125/74     Pulse Rate 07/18/16 0023 88     Resp 07/18/16 0023 18     Temp 07/18/16 0023 97.5 F (36.4 C)     Temp Source 07/18/16 0023 Oral     SpO2  07/18/16 0023 97 %     Weight --      Height --      Head Circumference --      Peak Flow --      Pain Score 07/18/16 0020 9     Pain Loc --      Pain Edu? --      Excl. in GC? --     Constitutional: Alert and oriented. Well appearing and in no acute distress. Eyes: Conjunctivae are normal. PERRL. EOMI. Head: Atraumatic. Ears:  Healthy appearing ear canals and TMs bilaterally Nose: No congestion/rhinnorhea. Mouth/Throat: Mucous membranes are moist.  Oropharynx non-erythematous. Neck: No cervical spine tenderness to palpation. Cardiovascular: Normal rate, regular rhythm. Good peripheral  circulation. Grossly normal heart sounds. Respiratory: Normal respiratory effort.  No retractions. Lungs CTAB. Gastrointestinal: Soft and nontender. No distention.  Musculoskeletal: No lower extremity tenderness nor edema. No gross deformities of extremities. Neurologic:  Normal speech and language. No gross focal neurologic deficits are appreciated.  Skin:  Skin is warm, dry and intact. No rash noted. Psychiatric: Mood and affect are normal. Speech and behavior are normal.    RADIOLOGY I, [place] N BROWN, personally viewed and evaluated these images (plain radiographs) as part of my medical decision making, as well as reviewing the written report by the radiologist.     Procedures    INITIAL IMPRESSION / ASSESSMENT AND PLAN / ED COURSE  Pertinent labs & imaging results that were available during my care of the patient were reviewed by me and considered in my medical decision making (see chart for details).   Patient given Toradol 60 mg IM Lidoderm patch applied.  Clinical Course    ____________________________________________  FINAL CLINICAL IMPRESSION(S) / ED DIAGNOSES  Final diagnoses:  Tendinosis  Acute pain of right shoulder     MEDICATIONS GIVEN DURING THIS VISIT:  Medications  lidocaine (LIDODERM) 5 % 1 patch (1 patch Transdermal Patch Applied 07/18/16 0206)  ketorolac (TORADOL) injection 60 mg (60 mg Intramuscular Given 07/18/16 0203)     NEW OUTPATIENT MEDICATIONS STARTED DURING THIS VISIT:  New Prescriptions   No medications on file    Modified Medications   No medications on file    Discontinued Medications   No medications on file     Note:  This document was prepared using Dragon voice recognition software and may include unintentional dictation errors.    Darci Current, MD 07/18/16 [phone]

## 2016-07-18 NOTE — ED Triage Notes (Addendum)
Patient ambulatory to triage with steady gait, without difficulty or distress noted, some slurring of speech noted; pt has appt with orthopedist 11/3 and c/o persistent right shoulder pain; 4th visit here for same

## 2017-03-08 ENCOUNTER — Emergency Department
Admission: EM | Admit: 2017-03-08 | Discharge: 2017-03-08 | Disposition: A | Discharge status: Home / Self Care | Payer: BLUE CROSS/BLUE SHIELD | Attending: Emergency Medicine | Admitting: Emergency Medicine

## 2017-03-08 ENCOUNTER — Encounter: Payer: Self-pay | Admitting: Emergency Medicine

## 2017-03-08 DIAGNOSIS — M25511 Pain in right shoulder: Secondary | ICD-10-CM | POA: Diagnosis present

## 2017-03-08 DIAGNOSIS — Z79899 Other long term (current) drug therapy: Secondary | ICD-10-CM | POA: Insufficient documentation

## 2017-03-08 MED ORDER — CYCLOBENZAPRINE HCL 5 MG PO TABS: 5.0000 mg | ORAL_TABLET | Freq: Three times a day (TID) | ORAL | 0 refills | Status: AC | PRN

## 2017-03-08 MED ORDER — KETOROLAC TROMETHAMINE 60 MG/2ML IM SOLN
30.0000 mg | Freq: Once | INTRAMUSCULAR | Status: AC
  Administered 2017-03-08: 30 mg via INTRAMUSCULAR
  Filled 2017-03-08: qty 2

## 2017-03-08 MED ORDER — LIDOCAINE 5 % EX PTCH: 1.0000 | MEDICATED_PATCH | Freq: Two times a day (BID) | CUTANEOUS | 0 refills | Status: AC

## 2017-03-08 MED ORDER — LIDOCAINE 5 % EX PTCH
1.0000 | MEDICATED_PATCH | CUTANEOUS | Status: DC
  Administered 2017-03-08: 1 via TRANSDERMAL
  Filled 2017-03-08: qty 1

## 2017-03-08 MED ORDER — MELOXICAM 15 MG PO TABS: 15.0000 mg | ORAL_TABLET | Freq: Every day | ORAL | 0 refills | Status: AC

## 2017-03-08 NOTE — ED Provider Notes (Signed)
[facility] Emergency Department Provider Note  ____________________________________________  Time seen: Approximately 5:45 PM  I have reviewed the triage vital signs and the nursing notes.   HISTORY  Chief Complaint Back Pain and Shoulder Pain    HPI Marie Sawyer is a 46 y.o. female that presents to the emergency department with right shoulder pain  that has worsened this week following a shoulder surgery 2 months ago.She states that she went back to work earlier than she should have. She was not scheduled to go back for 1 more week. 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 She states that pain in her shoulder feels like a throbbing pain. It does not radiate. No recent trauma. She is scheduled to see her surgeon on Thursday. She has taken Aleve and Tylenol for pain, which is not helping. She drove herself here but heard in the lobby that she will need a driver to receive narcotic pain medication. She denies fever, shortness of breath, chest pain, nausea, vomiting, abdominal pain, numbness, tingling.   Past Medical History:  Diagnosis Date  . Anxiety   . Back pain   . Depression     Patient Active Problem List   Diagnosis Date Noted  . Clinical depression 01/29/2016  . Cephalalgia 01/29/2016  . Enlarged thyroid 01/29/2016  . Midline low back pain 01/29/2016    Past Surgical History:  Procedure Laterality Date  . CESAREAN SECTION    . SHOULDER SURGERY Right   . TUBAL LIGATION      Prior to Admission medications   Medication Sig Start Date End Date Taking? Authorizing Provider  buPROPion (WELLBUTRIN XL) 300 MG 24 hr tablet Take 300 mg by mouth daily.    [provider]  clonazePAM (KLONOPIN) 1 MG tablet Take 1 mg by mouth 2 (two) times daily as needed for anxiety.    [provider]  cyclobenzaprine (FLEXERIL) 5 MG tablet Take 1 tablet (5 mg total) by mouth 3 (three) times  daily as needed for muscle spasms. 03/08/17 03/15/17  Enid Derry, PA-C  ibuprofen (ADVIL,MOTRIN) 200 MG tablet Take 200 mg by mouth every 6 (six) hours as needed.    [provider]  lidocaine (LIDODERM) 5 % Place 1 patch onto the skin every 12 (twelve) hours. Remove & Discard patch within 12 hours or as directed by MD 03/08/17 03/08/18  Enid Derry, PA-C  meloxicam (MOBIC) 15 MG tablet Take 1 tablet (15 mg total) by mouth daily. 03/08/17 03/18/17  Enid Derry, PA-C  oxyCODONE-acetaminophen (ROXICET) 5-325 MG tablet Take 1 tablet by mouth every 4 (four) hours as needed for severe pain. 07/11/16   Darci Current, MD  traMADol (ULTRAM) 50 MG tablet Take 1 tablet (50 mg total) by mouth every 6 (six) hours as needed. 04/23/16   Jennye Moccasin, MD  zolpidem (AMBIEN) 10 MG tablet Take 10 mg by mouth at bedtime as needed for sleep.    [provider]    Allergies Patient has no known allergies.  Family History  Problem Relation Age of Onset  . Diabetes Mother   . Liver disease Mother   . Kidney disease Mother   . Hypertension Father     Social History Social History  Substance Use Topics  . Smoking status: Never Smoker  . Smokeless tobacco: Never Used  . Alcohol use No     Review of Systems  Constitutional: No fever/chills Cardiovascular: No chest pain.  Respiratory: No SOB. Gastrointestinal: No abdominal pain.  No nausea, no vomiting.  Musculoskeletal: Positive for shoulder pain.  Skin: Negative for ecchymosis. Neurological: Negative for numbness or tingling   ____________________________________________   PHYSICAL EXAM:  VITAL SIGNS: ED Triage Vitals  Enc Vitals Group     BP 03/08/17 1714 (!) 153/88     Pulse Rate 03/08/17 1714 98     Resp 03/08/17 1714 14     Temp 03/08/17 1714 97.9 F (36.6 C)     Temp Source 03/08/17 1714 Oral     SpO2 03/08/17 1714 98 %     Weight --      Height --      Head Circumference --      Peak Flow --      Pain  Score 03/08/17 1710 6     Pain Loc --      Pain Edu? --      Excl. in GC? --      Constitutional: Alert and oriented. Well appearing and in no acute distress. Eyes: Conjunctivae are normal. PERRL. EOMI. Head: Atraumatic. ENT:      Ears:      Nose: No congestion/rhinnorhea.      Mouth/Throat: Mucous membranes are moist.  Neck: No stridor.   Cardiovascular: Normal rate, regular rhythm.  Good peripheral circulation. 2+ radial pulses. Respiratory: Normal respiratory effort without tachypnea or retractions. Lungs CTAB. Good air entry to the bases with no decreased or absent breath sounds. Musculoskeletal: Full range of motion to all extremities. No gross deformities appreciated. No tenderness to palpation over right shoulder. No tenderness to palpation over cervical, thoracic or lumbar spine. Neurologic:  Normal speech and language. No gross focal neurologic deficits are appreciated.  Skin:  Skin is warm, dry and intact. 1 inch scar in right axilla.    ____________________________________________   LABS (all labs ordered are listed, but only abnormal results are displayed)  Labs Reviewed - No data to display ____________________________________________  EKG   ____________________________________________  RADIOLOGY   No results found.  ____________________________________________    PROCEDURES  Procedure(s) performed:    Procedures    Medications  lidocaine (LIDODERM) 5 % 1 patch (1 patch Transdermal Patch Applied 03/08/17 1808)  ketorolac (TORADOL) injection 30 mg (30 mg Intramuscular Given 03/08/17 1809)     ____________________________________________   INITIAL IMPRESSION / ASSESSMENT AND PLAN / ED COURSE  Pertinent labs & imaging results that were available during my care of the patient were reviewed by me and considered in my medical decision making (see chart for details).  Review of the [place] CSRS was performed in accordance of the [facility] prior to  dispensing any controlled drugs.   Patient's diagnosis is consistent with shoulder pain and history of shoulder surgery. Vital signs and exam are reassuring. Patient has an appointment with her surgeon on Thursday. No recent trauma. Patient did not originally mention any narcotic pain medication. After a direct question about narcotic pain medication, patient disclosed that she was given oxycodone in April after her surgery. She failed to disclose the other 2 narcotic pain prescriptions she had in May. I told patient that I would not prescribe her narcotic pain medication for her shoulder. She then states that her upper back has been hurting as well and asked if she could get pain medication for that. I told her that I cannot do narcotics for that either and she proceeded to request tramadol because "it is not narcotic." She was given IM Toradol and  lidocaine patch. Patient will be discharged home with prescriptions for meloxicam, flexeril, and lidocaine patch. Patient is to follow up with ortho as directed. Patient is given ED precautions to return to the ED for any worsening or new symptoms.     ____________________________________________  FINAL CLINICAL IMPRESSION(S) / ED DIAGNOSES  Final diagnoses:  Right shoulder pain, unspecified chronicity      NEW MEDICATIONS STARTED DURING THIS VISIT:  Discharge Medication List as of 03/08/2017  6:12 PM    START taking these medications   Details  lidocaine (LIDODERM) 5 % Place 1 patch onto the skin every 12 (twelve) hours. Remove & Discard patch within 12 hours or as directed by MD, Starting Sat 03/08/2017, Until Sun 03/08/2018, Print            This chart was dictated using voice recognition software/Dragon. Despite best efforts to proofread, errors can occur which can change the meaning. Any change was purely unintentional.    Enid Derry, PA-C 03/08/17 2011    Jene Every, MD 03/08/17 2133

## 2017-03-08 NOTE — ED Notes (Signed)
Pain in right shoulder and upper back. Increased pain with movement. History of same with surgery in April. Denies injury.

## 2017-03-08 NOTE — ED Triage Notes (Signed)
Pt to ED via POV for right shoulder pain and upper back pain. Pt states that she has been seen here before for the same, has since had shoulder surgery in April. Pt states that she is still having pain in the shoulder, has appt with her surgeon on 6/21.

## 2017-05-20 ENCOUNTER — Encounter: Payer: Self-pay | Admitting: Emergency Medicine

## 2017-05-20 DIAGNOSIS — Z79899 Other long term (current) drug therapy: Secondary | ICD-10-CM | POA: Diagnosis not present

## 2017-05-20 DIAGNOSIS — R42 Dizziness and giddiness: Secondary | ICD-10-CM | POA: Diagnosis not present

## 2017-05-20 DIAGNOSIS — R51 Headache: Secondary | ICD-10-CM | POA: Diagnosis present

## 2017-05-20 NOTE — ED Triage Notes (Signed)
Patient ambulatory to triage with steady gait, without difficulty or distress noted; pt reports generalized HA x 3 days accomp by N/V with hx of same

## 2017-05-21 ENCOUNTER — Emergency Department
Admission: EM | Admit: 2017-05-21 | Discharge: 2017-05-21 | Disposition: A | Discharge status: Home / Self Care | Payer: BLUE CROSS/BLUE SHIELD | Attending: Emergency Medicine | Admitting: Emergency Medicine

## 2017-05-21 DIAGNOSIS — R42 Dizziness and giddiness: Secondary | ICD-10-CM

## 2017-05-21 NOTE — ED Notes (Signed)
Patient verbalizes understanding of d/c instructions and follow-up. VS stable and pain controlled per patient.  Patient in NAD at time of d/c and denies further concerns regarding this visit. Patient stable at the time of departure from the unit, departing unit by the safest and most appropriate manner per that patients condition and limitations. Patient advised to return to the ED at any time for emergent concerns, or for new/worsening symptoms.   Pt refused wheel chair out  

## 2017-05-21 NOTE — ED Provider Notes (Signed)
[facility] Emergency Department Provider Note    First MD Initiated Contact with Patient 05/21/17 [phone]     (approximate)  I have reviewed the triage vital signs and the nursing notes.   HISTORY  Chief Complaint Headache   HPI Marie Sawyer is a 46 y.o. female with below list of chronic medical conditions including vertigo presents to the emergency department with intermittent vertigo times one day which patient states is "normal for me". Patient states vertigo resolved while in the waiting room. Patient has no complaints at present. Patient denies any weakness numbness gait instability. Patient denies any headache at present.   Past Medical History:  Diagnosis Date  . Anxiety   . Back pain   . Depression     Patient Active Problem List   Diagnosis Date Noted  . Clinical depression 01/29/2016  . Cephalalgia 01/29/2016  . Enlarged thyroid 01/29/2016  . Midline low back pain 01/29/2016    Past Surgical History:  Procedure Laterality Date  . CESAREAN SECTION    . SHOULDER SURGERY Right   . TUBAL LIGATION      Prior to Admission medications   Medication Sig Start Date End Date Taking? Authorizing Provider  buPROPion (WELLBUTRIN XL) 300 MG 24 hr tablet Take 300 mg by mouth daily.    [provider]  clonazePAM (KLONOPIN) 1 MG tablet Take 1 mg by mouth 2 (two) times daily as needed for anxiety.    [provider]  ibuprofen (ADVIL,MOTRIN) 200 MG tablet Take 200 mg by mouth every 6 (six) hours as needed.    [provider]  lidocaine (LIDODERM) 5 % Place 1 patch onto the skin every 12 (twelve) hours. Remove & Discard patch within 12 hours or as directed by MD 03/08/17 03/08/18  Enid DerryWagner, Ashley, PA-C  oxyCODONE-acetaminophen (ROXICET) 5-325 MG tablet Take 1 tablet by mouth every 4 (four) hours as needed for severe pain. 07/11/16   Darci CurrentBrown, Ingalls N, MD  traMADol (ULTRAM) 50 MG tablet Take 1 tablet (50 mg total) by mouth  every 6 (six) hours as needed. 04/23/16   Jennye MoccasinQuigley, Brian S, MD  zolpidem (AMBIEN) 10 MG tablet Take 10 mg by mouth at bedtime as needed for sleep.    [provider]    Allergies No known drug allergies  Family History  Problem Relation Age of Onset  . Diabetes Mother   . Liver disease Mother   . Kidney disease Mother   . Hypertension Father     Social History Social History  Substance Use Topics  . Smoking status: Never Smoker  . Smokeless tobacco: Never Used  . Alcohol use No    Review of Systems Constitutional: No fever/chills Eyes: No visual changes. ENT: No sore throat. Cardiovascular: Denies chest pain. Respiratory: Denies shortness of breath. Gastrointestinal: No abdominal pain.  No nausea, no vomiting.  No diarrhea.  No constipation. Genitourinary: Negative for dysuria. Musculoskeletal: Negative for neck pain.  Negative for back pain. Integumentary: Negative for rash. Neurological: Negative for headaches, focal weakness or numbness.Positive for vertigo (resolved)   ____________________________________________   PHYSICAL EXAM:  VITAL SIGNS: ED Triage Vitals  Enc Vitals Group     BP 05/20/17 2202 (!) 144/83     Pulse Rate 05/20/17 2202 91     Resp --      Temp 05/20/17 2202 97.8 F (36.6 C)     Temp Source 05/20/17 2202 Oral     SpO2 05/20/17 2202 100 %  Weight 05/20/17 2203 72.6 kg (160 lb)     Height 05/20/17 2203 1.499 m (4\' 11" )     Head Circumference --      Peak Flow --      Pain Score 05/20/17 2203 6     Pain Loc --      Pain Edu? --      Excl. in GC? --     Constitutional: Alert and oriented. Well appearing and in no acute distress. Eyes: Conjunctivae are normal. PERRL. EOMI. Head: Atraumatic. Mouth/Throat: Mucous membranes are moist. Neck: No stridor.   Cardiovascular: Normal rate, regular rhythm. Good peripheral circulation. Grossly normal heart sounds. Respiratory: Normal respiratory effort.  No retractions. Lungs  CTAB. Gastrointestinal: Soft and nontender. No distention.  Musculoskeletal: No lower extremity tenderness nor edema. No gross deformities of extremities. Neurologic:  Normal speech and language. No gross focal neurologic deficits are appreciated.  Skin:  Skin is warm, dry and intact. No rash noted. Psychiatric: Mood and affect are normal. Speech and behavior are normal.   Procedures   ____________________________________________   INITIAL IMPRESSION / ASSESSMENT AND PLAN / ED COURSE  Pertinent labs & imaging results that were available during my care of the patient were reviewed by me and considered in my medical decision making (see chart for details).        ____________________________________________  FINAL CLINICAL IMPRESSION(S) / ED DIAGNOSES  Final diagnoses:  Vertigo     MEDICATIONS GIVEN DURING THIS VISIT:  Medications - No data to display   NEW OUTPATIENT MEDICATIONS STARTED DURING THIS VISIT:  New Prescriptions   No medications on file    Modified Medications   No medications on file    Discontinued Medications   No medications on file     Note:  This document was prepared using Dragon voice recognition software and may include unintentional dictation errors.    Darci Current, MD 05/21/17 [phone]

## 2017-05-21 NOTE — ED Notes (Signed)
Patient went into work tonight and got vertigo and had to call out. Patient did vomit. Needs a work note. Patient no longer dizzy.

## 2017-07-21 IMAGING — CR DG SHOULDER 2+V*R*
3 series · 3 of 3 positions shown · non-contrast
Comparison: 06/24/2016.

CLINICAL DATA: Increasing right shoulder pain.

EXAM:
RIGHT SHOULDER - 2+ VIEW

[shoulder grashey]
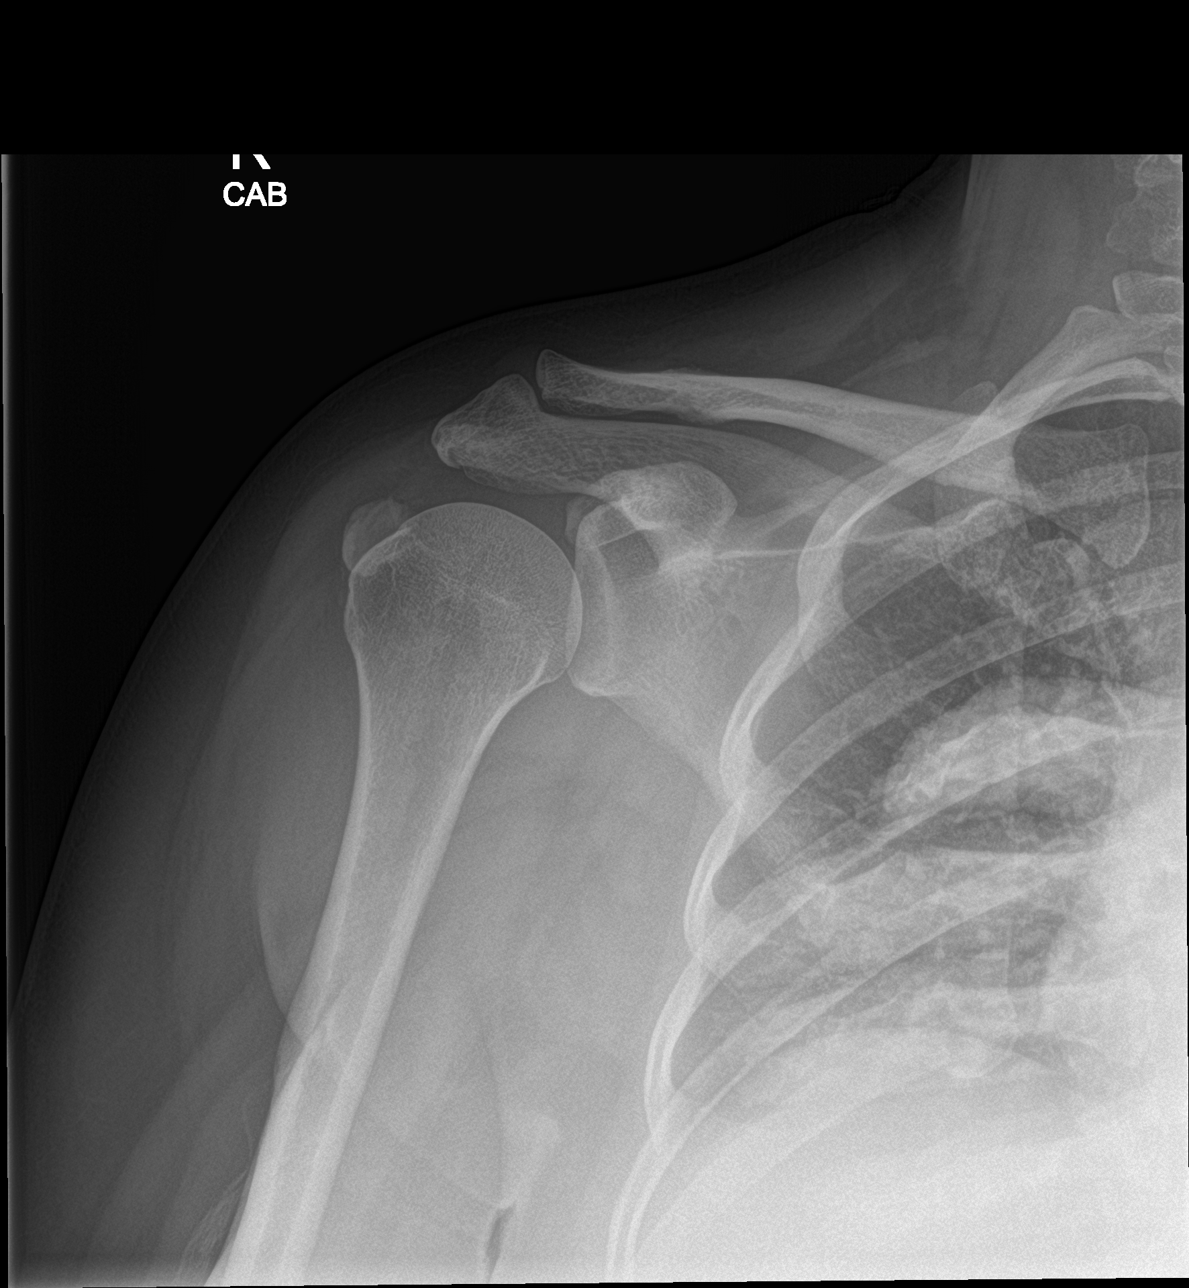

[shoulder y view]
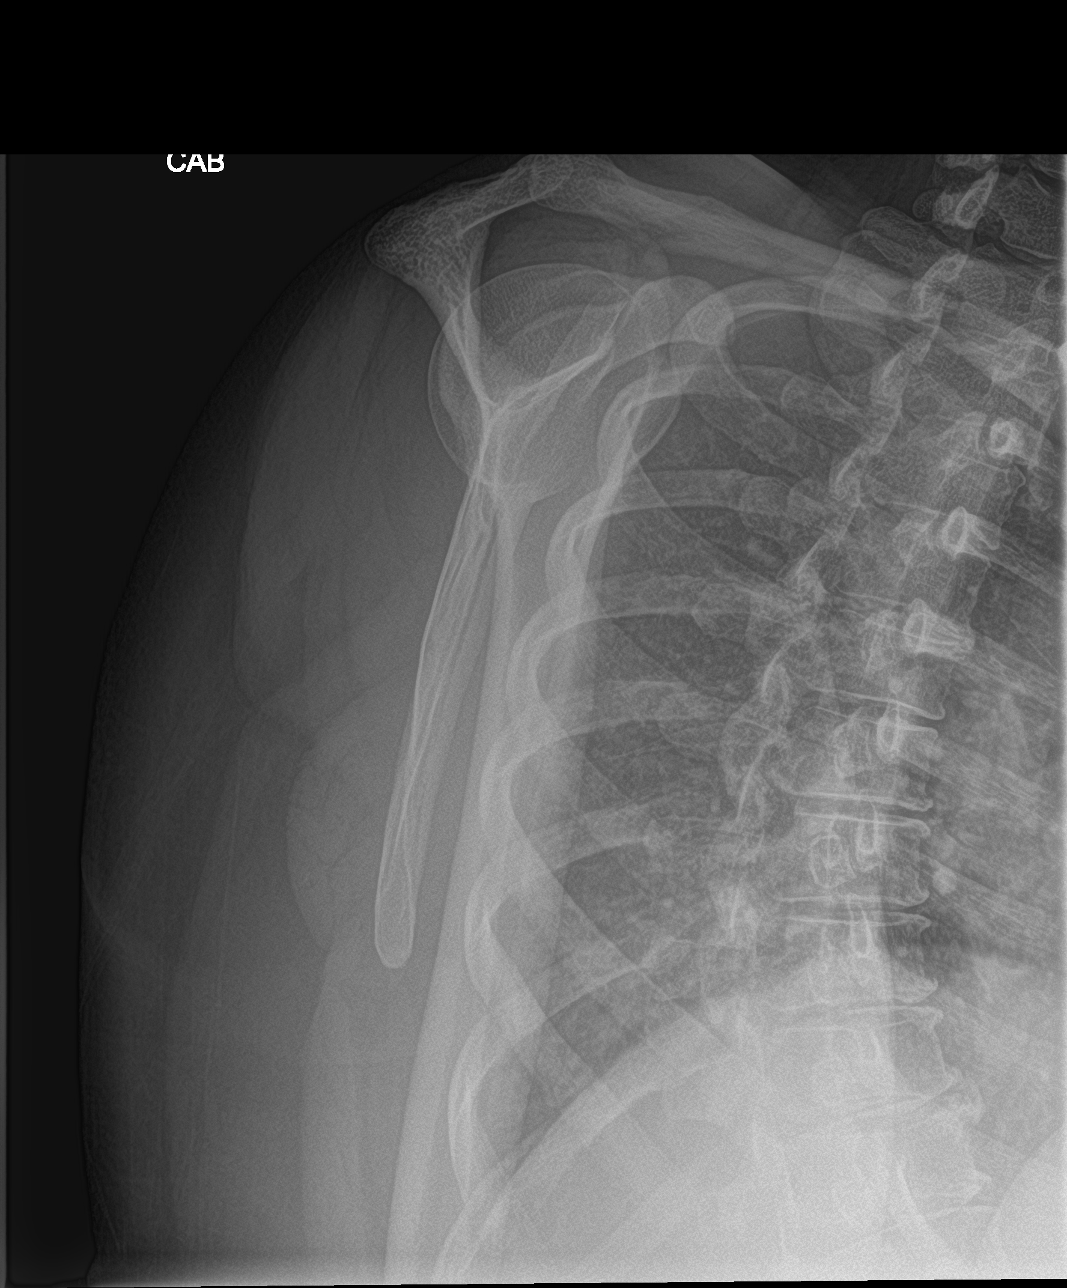

[shoulder axillary]
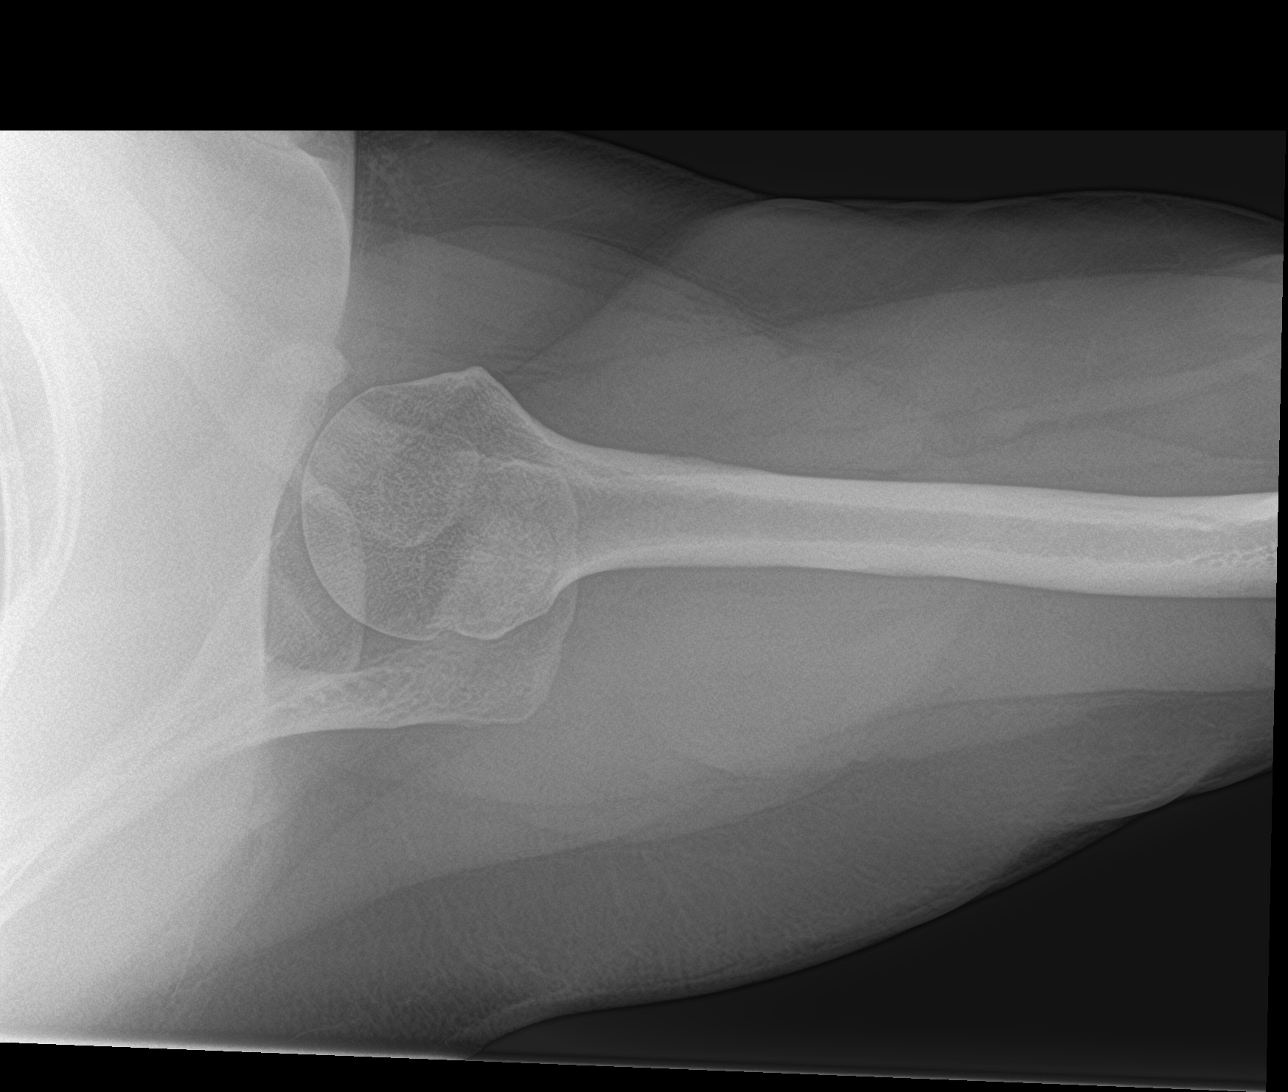

[3 of 3 positions shown; findings below may reference images not displayed]

FINDINGS: Prominent calcific density noted over the right supraspinatus region
consistent with calcific supraspinatus tendinosis. No acute bony
abnormality identified. No evidence of fracture dislocation.
IMPRESSION: Prominent calcific density noted over the right supraspinatus region
consistent with calcific supraspinatus tendinosis. No acute bony or
joint abnormality.

## 2017-09-26 ENCOUNTER — Emergency Department
Admission: EM | Admit: 2017-09-26 | Discharge: 2017-09-26 | Disposition: A | Discharge status: Home / Self Care | Payer: BLUE CROSS/BLUE SHIELD | Attending: Emergency Medicine | Admitting: Emergency Medicine

## 2017-09-26 ENCOUNTER — Other Ambulatory Visit: Payer: Self-pay

## 2017-09-26 ENCOUNTER — Encounter: Payer: Self-pay | Admitting: Emergency Medicine

## 2017-09-26 DIAGNOSIS — J029 Acute pharyngitis, unspecified: Secondary | ICD-10-CM | POA: Diagnosis present

## 2017-09-26 DIAGNOSIS — Z79899 Other long term (current) drug therapy: Secondary | ICD-10-CM | POA: Insufficient documentation

## 2017-09-26 DIAGNOSIS — J069 Acute upper respiratory infection, unspecified: Secondary | ICD-10-CM | POA: Diagnosis not present

## 2017-09-26 MED ORDER — AMOXICILLIN 500 MG PO TABS: 500.0000 mg | ORAL_TABLET | Freq: Three times a day (TID) | ORAL | 0 refills | Status: DC

## 2017-09-26 MED ORDER — MAGIC MOUTHWASH W/LIDOCAINE: 10.0000 mL | Freq: Four times a day (QID) | ORAL | 0 refills | Status: DC | PRN

## 2017-09-26 NOTE — ED Triage Notes (Signed)
Pt started with cough around christmas and has been having intermittent laryngitis symptoms since but now c/o sore throat along with odynophagia. No distress. Unlabored. Handling secretions.

## 2017-09-26 NOTE — ED Notes (Signed)
See triage note  Presents with sore throat for the past 2 weeks  Now having increased pain to throat for the past 2-3 days   Subjective fever at home  But afebrile on arrival

## 2017-09-26 NOTE — ED Provider Notes (Signed)
[facility] Emergency Department Provider Note  ____________________________________________  Time seen: Approximately 7:58 AM  I have reviewed the triage vital signs and the nursing notes.   HISTORY  Chief Complaint Sore Throat    HPI Marie Sawyer is a 47 y.o. female who presents to the emergency department for treatment and evaluation of cough and sore throat that started about 2 weeks ago. She reports intermittent laryngitis. No relief with OTC medications. Pain has worsened over the past 24 hours and it now hurts to swallow.  Past Medical History:  Diagnosis Date  . Anxiety   . Back pain   . Depression     Patient Active Problem List   Diagnosis Date Noted  . Clinical depression 01/29/2016  . Cephalalgia 01/29/2016  . Enlarged thyroid 01/29/2016  . Midline low back pain 01/29/2016    Past Surgical History:  Procedure Laterality Date  . CESAREAN SECTION    . SHOULDER SURGERY Right   . TUBAL LIGATION      Prior to Admission medications   Medication Sig Start Date End Date Taking? Authorizing Provider  amoxicillin (AMOXIL) 500 MG tablet Take 1 tablet (500 mg total) by mouth 3 (three) times daily. 09/26/17   Althia Egolf B, FNP  buPROPion (WELLBUTRIN XL) 300 MG 24 hr tablet Take 300 mg by mouth daily.    [provider]  clonazePAM (KLONOPIN) 1 MG tablet Take 1 mg by mouth 2 (two) times daily as needed for anxiety.    [provider]  ibuprofen (ADVIL,MOTRIN) 200 MG tablet Take 200 mg by mouth every 6 (six) hours as needed.    [provider]  lidocaine (LIDODERM) 5 % Place 1 patch onto the skin every 12 (twelve) hours. Remove & Discard patch within 12 hours or as directed by MD 03/08/17 03/08/18  Enid Derry, PA-C  magic mouthwash w/lidocaine SOLN Take 10 mLs by mouth 4 (four) times daily as needed for mouth pain. 30ml diphenhydramine; 30ml Maalox; 20ml Lidocaine 09/26/17   Mersades Barbaro B, FNP   oxyCODONE-acetaminophen (ROXICET) 5-325 MG tablet Take 1 tablet by mouth every 4 (four) hours as needed for severe pain. 07/11/16   Darci Current, MD  traMADol (ULTRAM) 50 MG tablet Take 1 tablet (50 mg total) by mouth every 6 (six) hours as needed. 04/23/16   Jennye Moccasin, MD  zolpidem (AMBIEN) 10 MG tablet Take 10 mg by mouth at bedtime as needed for sleep.    [provider]    Allergies Patient has no known allergies.  Family History  Problem Relation Age of Onset  . Diabetes Mother   . Liver disease Mother   . Kidney disease Mother   . Hypertension Father     Social History Social History   Tobacco Use  . Smoking status: Never Smoker  . Smokeless tobacco: Never Used  Substance Use Topics  . Alcohol use: No  . Drug use: No    Review of Systems Constitutional: Negative for fever. Eyes: No visual changes. ENT: Positive for sore throat; Negative for difficulty swallowing. Respiratory: Denies shortness of breath. Gastrointestinal: No abdominal pain.  No nausea, no vomiting.  No diarrhea.  Genitourinary: Negative for dysuria. Musculoskeletal: Negative for generalized body aches. Skin: Negative for rash. Neurological: Negative for headaches, Negative  focal weakness or numbness.  ____________________________________________   PHYSICAL EXAM:  VITAL SIGNS: ED Triage Vitals  Enc Vitals Group     BP 09/26/17 0728 139/80     Pulse Rate  09/26/17 0728 85     Resp 09/26/17 0728 16     Temp 09/26/17 0728 97.8 F (36.6 C)     Temp Source 09/26/17 0728 Oral     SpO2 09/26/17 0728 98 %     Weight 09/26/17 0725 165 lb (74.8 kg)     Height 09/26/17 0725 4\' 11"  (1.499 m)     Head Circumference --      Peak Flow --      Pain Score 09/26/17 0724 8     Pain Loc --      Pain Edu? --      Excl. in GC? --    Constitutional: Alert and oriented. Well appearing and in no acute distress. Eyes: Conjunctivae are normal.  Head: Atraumatic. Nose: No  congestion/rhinnorhea. Mouth/Throat: Mucous membranes are moist.  Oropharynx erythematous, tonsils 2+ with exudate. Uvula is midline. Neck: No stridor.  Lymphatic: Lymphadenopathy: left anterior cervical lymphadenopathy. Cardiovascular: Normal rate, regular rhythm. Good peripheral circulation. Respiratory: Normal respiratory effort. Lungs CTAB. Gastrointestinal: Soft and nontender. Musculoskeletal: No lower extremity tenderness nor edema.  Neurologic:  Normal speech and language. No gross focal neurologic deficits are appreciated. Speech is normal. No gait instability. Skin:  Skin is warm, dry and intact. No rash noted Psychiatric: Mood and affect are normal. Speech and behavior are normal.  ____________________________________________   LABS (all labs ordered are listed, but only abnormal results are displayed)  Labs Reviewed - No data to display ____________________________________________  EKG  Not indicated. ____________________________________________  RADIOLOGY  None ____________________________________________   PROCEDURES  Procedure(s) performed: None  Critical Care performed: No ____________________________________________   INITIAL IMPRESSION / ASSESSMENT AND PLAN / ED COURSE  47 year old female presenting to the emergency department for evaluation and treatment of cough and sore throat.  She has a tender lymphadenopathy on the left side and reports that the pain in her throat has been increasing over the past day or so.  She will be treated with amoxicillin and Magic mouthwash.  She was encouraged to return to the emergency department for symptoms of change or worsen if she is unable to schedule an appointment with her primary care provider.  Pertinent labs & imaging results that were available during my care of the patient were reviewed by me and considered in my medical decision making (see chart for  details). ____________________________________________  This SmartLink is deprecated. Use AVSMEDLIST instead to display the medication list for a patient.  FINAL CLINICAL IMPRESSION(S) / ED DIAGNOSES  Final diagnoses:  Pharyngitis, unspecified etiology  Upper respiratory tract infection, unspecified type    If controlled substance prescribed during this visit, 12 month history viewed on the [place] prior to issuing an initial prescription for Schedule II or III opiod.   Note:  This document was prepared using Dragon voice recognition software and may include unintentional dictation errors.    Chinita Pesterriplett, Azekiel Cremer B, FNP 09/26/17 1041    Myrna BlazerSchaevitz, David Matthew, MD 09/26/17 1248

## 2017-09-28 ENCOUNTER — Other Ambulatory Visit: Payer: Self-pay

## 2017-09-28 ENCOUNTER — Encounter: Payer: Self-pay | Admitting: Emergency Medicine

## 2017-09-28 ENCOUNTER — Emergency Department
Admission: EM | Admit: 2017-09-28 | Discharge: 2017-09-28 | Disposition: A | Discharge status: Home / Self Care | Payer: BLUE CROSS/BLUE SHIELD | Attending: Emergency Medicine | Admitting: Emergency Medicine

## 2017-09-28 DIAGNOSIS — Z79899 Other long term (current) drug therapy: Secondary | ICD-10-CM | POA: Insufficient documentation

## 2017-09-28 DIAGNOSIS — B349 Viral infection, unspecified: Secondary | ICD-10-CM | POA: Diagnosis not present

## 2017-09-28 DIAGNOSIS — J029 Acute pharyngitis, unspecified: Secondary | ICD-10-CM | POA: Diagnosis present

## 2017-09-28 LAB — INFLUENZA PANEL BY PCR (TYPE A & B)
Influenza A By PCR: NEGATIVE
Influenza B By PCR: NEGATIVE

## 2017-09-28 NOTE — ED Provider Notes (Signed)
[facility] Emergency Department Provider Note  ____________________________________________   First MD Initiated Contact with Patient 09/28/17 1141     (approximate)  I have reviewed the triage vital signs and the nursing notes.   HISTORY  Chief Complaint Sore Throat   HPI Marie Sawyer is a 47 y.o. female is here complaining of sore throat and body aches. Patient was seen 2 days ago for the same complaints. She states that she is currently taking amoxicillin but has developed body aches since that time. She is unsure of fever. She states that she knows that she is unable to go to work tonight as she is only called out sick and needs a work note.  She rates her pain is 3 out of 10.   Past Medical History:  Diagnosis Date  . Anxiety   . Back pain   . Depression     Patient Active Problem List   Diagnosis Date Noted  . Clinical depression 01/29/2016  . Cephalalgia 01/29/2016  . Enlarged thyroid 01/29/2016  . Midline low back pain 01/29/2016    Past Surgical History:  Procedure Laterality Date  . CESAREAN SECTION    . SHOULDER SURGERY Right   . TUBAL LIGATION      Prior to Admission medications   Medication Sig Start Date End Date Taking? Authorizing Provider  amoxicillin (AMOXIL) 500 MG tablet Take 1 tablet (500 mg total) by mouth 3 (three) times daily. 09/26/17   Triplett, Cari B, FNP  buPROPion (WELLBUTRIN XL) 300 MG 24 hr tablet Take 300 mg by mouth daily.    [provider]  clonazePAM (KLONOPIN) 1 MG tablet Take 1 mg by mouth 2 (two) times daily as needed for anxiety.    [provider]  ibuprofen (ADVIL,MOTRIN) 200 MG tablet Take 200 mg by mouth every 6 (six) hours as needed.    [provider]  lidocaine (LIDODERM) 5 % Place 1 patch onto the skin every 12 (twelve) hours. Remove & Discard patch within 12 hours or as directed by MD 03/08/17 03/08/18  Enid DerryWagner, Ashley, PA-C  magic mouthwash w/lidocaine SOLN Take 10  mLs by mouth 4 (four) times daily as needed for mouth pain. 30ml diphenhydramine; 30ml Maalox; 20ml Lidocaine 09/26/17   Triplett, Cari B, FNP  oxyCODONE-acetaminophen (ROXICET) 5-325 MG tablet Take 1 tablet by mouth every 4 (four) hours as needed for severe pain. 07/11/16   Darci CurrentBrown,  N, MD  traMADol (ULTRAM) 50 MG tablet Take 1 tablet (50 mg total) by mouth every 6 (six) hours as needed. 04/23/16   Jennye MoccasinQuigley, Brian S, MD  zolpidem (AMBIEN) 10 MG tablet Take 10 mg by mouth at bedtime as needed for sleep.    [provider]    Allergies Patient has no known allergies.  Family History  Problem Relation Age of Onset  . Diabetes Mother   . Liver disease Mother   . Kidney disease Mother   . Hypertension Father     Social History Social History   Tobacco Use  . Smoking status: Never Smoker  . Smokeless tobacco: Never Used  Substance Use Topics  . Alcohol use: No  . Drug use: No    Review of Systems Constitutional: subjective fever/chills Eyes: No visual changes. ENT: positive sore throat. Cardiovascular: Denies chest pain. Respiratory: Denies shortness of breath. Gastrointestinal:   No nausea, no vomiting.   Musculoskeletal: positive for muscle aches. Skin: Negative for rash. Neurological: Negative for headaches ___________________________________________   PHYSICAL EXAM:  VITAL SIGNS: ED Triage Vitals  Enc Vitals Group     BP 09/28/17 1121 125/67     Pulse Rate 09/28/17 1121 89     Resp --      Temp 09/28/17 1121 97.7 F (36.5 C)     Temp Source 09/28/17 1121 Oral     SpO2 09/28/17 1121 99 %     Weight 09/28/17 1121 165 lb (74.8 kg)     Height 09/28/17 1121 4\' 11"  (1.499 m)     Head Circumference --      Peak Flow --      Pain Score 09/28/17 1123 3     Pain Loc --      Pain Edu? --      Excl. in GC? --    Constitutional: Alert and oriented. Well appearing and in no acute distress. Eyes: Conjunctivae are normal.  Head: Atraumatic. Nose: No  congestion/rhinnorhea.  eACs and TMs are clear bilaterally. Mouth/Throat: Mucous membranes are moist.  Oropharynx non-erythematous. No exudate. Moderate posterior drainage seen. Neck: No stridor.   Hematological/Lymphatic/Immunilogical: No cervical lymphadenopathy. Cardiovascular: Normal rate, regular rhythm. Grossly normal heart sounds.  Good peripheral circulation. Respiratory: Normal respiratory effort.  No retractions. Lungs CTAB. Gastrointestinal: Soft and nontender. No distention.  Musculoskeletal: moves upper extremities without any difficulty. Normal gait was noted. Neurologic:  Normal speech and language. No gross focal neurologic deficits are appreciated.  Skin:  Skin is warm, dry and intact. No rash noted. Psychiatric: Mood and affect are normal. Speech and behavior are normal.  ____________________________________________   LABS (all labs ordered are listed, but only abnormal results are displayed)  Labs Reviewed  INFLUENZA PANEL BY PCR (TYPE A & B)     PROCEDURES  Procedure(s) performed: None  Procedures  Critical Care performed: No  ____________________________________________   INITIAL IMPRESSION / ASSESSMENT AND PLAN / ED COURSE Patient was made aware that influenza test was negative. She is to continue taking amoxicillin until completely finished. She'll follow up with her PCP if any continued problems she is encouraged to drink fluids and take Tylenol if needed for muscle aches or throat pain.  ____________________________________________   FINAL CLINICAL IMPRESSION(S) / ED DIAGNOSES  Final diagnoses:  Pharyngitis, unspecified etiology  Viral illness     ED Discharge Orders    None       Note:  This document was prepared using Dragon voice recognition software and may include unintentional dictation errors.    Tommi Rumps, PA-C 09/28/17 1344    Phineas Semen, MD 09/28/17 [phone]

## 2017-09-28 NOTE — ED Notes (Signed)
FIRST NURSE NOTE: HEre Friday, wants work note for today, wants cough medicine. Already on atbx for cough

## 2017-09-28 NOTE — Discharge Instructions (Signed)
Call and make an appointment with your primary care provider if any continued problems. Continue taking amoxicillin to your completely finished. You may take Tylenol as needed for body aches or throat pain.

## 2017-09-28 NOTE — ED Triage Notes (Signed)
Pt to ED via POV c/o sore throat, pt states that she has been sick since around [place]. Pt states that she was seen here Friday similar symptoms. Pt also wants to get a work note for today. Pt in NAD at this time.

## 2018-08-02 ENCOUNTER — Emergency Department: Payer: Self-pay

## 2018-08-02 ENCOUNTER — Emergency Department
Admission: EM | Admit: 2018-08-02 | Discharge: 2018-08-02 | Disposition: A | Discharge status: Home / Self Care | Payer: Self-pay | Attending: Emergency Medicine | Admitting: Emergency Medicine

## 2018-08-02 ENCOUNTER — Other Ambulatory Visit: Payer: Self-pay

## 2018-08-02 ENCOUNTER — Encounter: Payer: Self-pay | Admitting: Emergency Medicine

## 2018-08-02 DIAGNOSIS — M79672 Pain in left foot: Secondary | ICD-10-CM

## 2018-08-02 DIAGNOSIS — M19072 Primary osteoarthritis, left ankle and foot: Secondary | ICD-10-CM | POA: Insufficient documentation

## 2018-08-02 DIAGNOSIS — M779 Enthesopathy, unspecified: Secondary | ICD-10-CM | POA: Insufficient documentation

## 2018-08-02 DIAGNOSIS — Z79899 Other long term (current) drug therapy: Secondary | ICD-10-CM | POA: Insufficient documentation

## 2018-08-02 MED ORDER — MELOXICAM 15 MG PO TABS: 15.0000 mg | ORAL_TABLET | Freq: Every day | ORAL | 0 refills | Status: AC

## 2018-08-02 MED ORDER — KETOROLAC TROMETHAMINE 30 MG/ML IJ SOLN
30.0000 mg | Freq: Once | INTRAMUSCULAR | Status: AC
  Administered 2018-08-02: 30 mg via INTRAMUSCULAR
  Filled 2018-08-02: qty 1

## 2018-08-02 NOTE — ED Triage Notes (Signed)
C/O left foot pain for months.  Not improving.

## 2018-08-02 NOTE — ED Notes (Signed)
IM to right deltoid patient tolerated well.

## 2018-08-02 NOTE — ED Provider Notes (Signed)
[facility] Emergency Department Provider Note  ____________________________________________  Time seen: Approximately 12:11 PM  I have reviewed the triage vital signs and the nursing notes.   HISTORY  Chief Complaint Foot Pain    HPI Marie Sawyer is a 47 y.o. female presents emergency department for evaluation of left foot pain for 8 months.  Patient states that foot is not painful when she is sitting still or when she first gets up walking.  She states that her foot starts to ache about 2 hours into her 8-hour shift at work.  She is on her feet all day at work.  Pain is primarily over the side of her foot in the back of her foot.  Her husband told her that she should get new shoes but her other foot does not hurt with her current shoes.  She has had a fracture to this foot in the past and was supposed to have "something fixed" but never did.  She is taken Tylenol and ibuprofen for pain.  Foot is not painful currently.   Past Medical History:  Diagnosis Date  . Anxiety   . Back pain   . Depression     Patient Active Problem List   Diagnosis Date Noted  . Clinical depression 01/29/2016  . Cephalalgia 01/29/2016  . Enlarged thyroid 01/29/2016  . Midline low back pain 01/29/2016    Past Surgical History:  Procedure Laterality Date  . CESAREAN SECTION    . SHOULDER SURGERY Right   . TUBAL LIGATION      Prior to Admission medications   Medication Sig Start Date End Date Taking? Authorizing Provider  amoxicillin (AMOXIL) 500 MG tablet Take 1 tablet (500 mg total) by mouth 3 (three) times daily. 09/26/17   Triplett, Cari B, FNP  buPROPion (WELLBUTRIN XL) 300 MG 24 hr tablet Take 300 mg by mouth daily.    [provider]  clonazePAM (KLONOPIN) 1 MG tablet Take 1 mg by mouth 2 (two) times daily as needed for anxiety.    [provider]  ibuprofen (ADVIL,MOTRIN) 200 MG tablet Take 200 mg by mouth every 6 (six) hours as needed.     [provider]  magic mouthwash w/lidocaine SOLN Take 10 mLs by mouth 4 (four) times daily as needed for mouth pain. 30ml diphenhydramine; 30ml Maalox; 20ml Lidocaine 09/26/17   Triplett, Cari B, FNP  meloxicam (MOBIC) 15 MG tablet Take 1 tablet (15 mg total) by mouth daily for 10 days. 08/02/18 08/12/18  Enid Derry, PA-C  oxyCODONE-acetaminophen (ROXICET) 5-325 MG tablet Take 1 tablet by mouth every 4 (four) hours as needed for severe pain. 07/11/16   Darci Current, MD  traMADol (ULTRAM) 50 MG tablet Take 1 tablet (50 mg total) by mouth every 6 (six) hours as needed. 04/23/16   Jennye Moccasin, MD  zolpidem (AMBIEN) 10 MG tablet Take 10 mg by mouth at bedtime as needed for sleep.    [provider]    Allergies Patient has no known allergies.  Family History  Problem Relation Age of Onset  . Diabetes Mother   . Liver disease Mother   . Kidney disease Mother   . Hypertension Father     Social History Social History   Tobacco Use  . Smoking status: Never Smoker  . Smokeless tobacco: Never Used  Substance Use Topics  . Alcohol use: No  . Drug use: No     Review of Systems  Constitutional: No fever/chills Respiratory:  No SOB. Gastrointestinal:  No nausea, no vomiting.  Musculoskeletal: Positive for foot pain Skin: Negative for rash, abrasions, lacerations, ecchymosis. Neurological: Negative for numbness or tingling   ____________________________________________   PHYSICAL EXAM:  VITAL SIGNS: ED Triage Vitals  Enc Vitals Group     BP 08/02/18 1122 133/76     Pulse Rate 08/02/18 1122 90     Resp 08/02/18 1122 16     Temp 08/02/18 1122 98.1 F (36.7 C)     Temp Source 08/02/18 1122 Oral     SpO2 08/02/18 1122 98 %     Weight 08/02/18 1118 164 lb 14.5 oz (74.8 kg)     Height --      Head Circumference --      Peak Flow --      Pain Score 08/02/18 1117 6     Pain Loc --      Pain Edu? --      Excl. in GC? --      Constitutional: Alert  and oriented. Well appearing and in no acute distress. Eyes: Conjunctivae are normal. PERRL. EOMI. Head: Atraumatic. ENT:      Ears:      Nose: No congestion/rhinnorhea.      Mouth/Throat: Mucous membranes are moist.  Neck: No stridor. Cardiovascular: Normal rate, regular rhythm.  Good peripheral circulation. Respiratory: Normal respiratory effort without tachypnea or retractions. Lungs CTAB. Good air entry to the bases with no decreased or absent breath sounds. Musculoskeletal: Full range of motion to all extremities. No gross deformities appreciated.  No tenderness to palpation over proximal lateral dorsal foot and posterior foot but patient elicits this location as where pain occurs.  Normal gait. Neurologic:  Normal speech and language. No gross focal neurologic deficits are appreciated.  Skin:  Skin is warm, dry and intact. No rash noted. Psychiatric: Mood and affect are normal. Speech and behavior are normal. Patient exhibits appropriate insight and judgement.   ____________________________________________   LABS (all labs ordered are listed, but only abnormal results are displayed)  Labs Reviewed - No data to display ____________________________________________  EKG   ____________________________________________  RADIOLOGY Lexine Baton, personally viewed and evaluated these images (plain radiographs) as part of my medical decision making, as well as reviewing the written report by the radiologist.  Dg Foot Complete Left  Result Date: 08/02/2018 CLINICAL DATA:  Left foot pain for months EXAM: LEFT FOOT - COMPLETE 3+ VIEW COMPARISON:  01/28/2012 FINDINGS: Again noted is spurring medially at the first MTP joint. Small subcortical lucency along the head of the first metatarsal has slightly increased in size compared to the Jan 28, 2012 exam. Appearance compatible with mild degenerative arthropathy. Normal alignment at the Lisfranc joint. Small type 1 accessory navicular.  Plantar calcaneal spur.  Minimal Achilles calcaneal spur. IMPRESSION: 1. Mild degenerative findings at the first MTP joint, minimally increased compared to 2013. 2. Type 1 accessory navicular. 3. Plantar calcaneal spur and small Achilles calcaneal spur. Electronically Signed   By: Gaylyn Rong M.D.   On: 08/02/2018 12:41    ____________________________________________    PROCEDURES  Procedure(s) performed:    Procedures    Medications  ketorolac (TORADOL) 30 MG/ML injection 30 mg (30 mg Intramuscular Given 08/02/18 1325)     ____________________________________________   INITIAL IMPRESSION / ASSESSMENT AND PLAN / ED COURSE  Pertinent labs & imaging results that were available during my care of the patient were reviewed by me and considered in my medical decision making (see chart for  details).  Review of the  [place] was performed in accordance of the [place] prior to dispensing any controlled drugs.   Patient presented to the emergency department for evaluation of chronic foot pain.  Patient has had symptoms for 8 months and have not changed in character today.  X-ray consistent with chronic changes.  IM Toradol was given.  Patient will be discharged home with prescriptions for Mobic. Patient is to follow up with primary care as directed. Patient is given ED precautions to return to the ED for any worsening or new symptoms.     ____________________________________________  FINAL CLINICAL IMPRESSION(S) / ED DIAGNOSES  Final diagnoses:  Left foot pain  Bone spur  Primary osteoarthritis of left foot      NEW MEDICATIONS STARTED DURING THIS VISIT:  ED Discharge Orders         Ordered    meloxicam (MOBIC) 15 MG tablet  Daily     08/02/18 1320              This chart was dictated using voice recognition software/Dragon. Despite best efforts to proofread, errors can occur which can change the meaning. Any change was purely unintentional.    Enid Derry, PA-C 08/02/18 1430    Pershing Proud Myra Rude, MD 08/02/18 [phone]

## 2018-08-02 NOTE — ED Notes (Signed)
Pt reports  Pain l  Foot worse on  Walking or  Standing  Symptoms have been  going on for a  While  Worse over last  Month  Denies any injury  -   Hx of  A bone fragment years  Ago

## 2018-10-28 ENCOUNTER — Encounter: Payer: Self-pay | Admitting: Emergency Medicine

## 2018-10-28 ENCOUNTER — Other Ambulatory Visit: Payer: Self-pay

## 2018-10-28 DIAGNOSIS — F419 Anxiety disorder, unspecified: Secondary | ICD-10-CM | POA: Diagnosis not present

## 2018-10-28 DIAGNOSIS — F329 Major depressive disorder, single episode, unspecified: Secondary | ICD-10-CM | POA: Diagnosis not present

## 2018-10-28 DIAGNOSIS — Z79899 Other long term (current) drug therapy: Secondary | ICD-10-CM | POA: Insufficient documentation

## 2018-10-28 DIAGNOSIS — J09X2 Influenza due to identified novel influenza A virus with other respiratory manifestations: Secondary | ICD-10-CM | POA: Insufficient documentation

## 2018-10-28 DIAGNOSIS — R509 Fever, unspecified: Secondary | ICD-10-CM | POA: Diagnosis present

## 2018-10-28 LAB — INFLUENZA PANEL BY PCR (TYPE A & B)
Influenza A By PCR: POSITIVE — AB
Influenza B By PCR: NEGATIVE

## 2018-10-28 NOTE — ED Triage Notes (Signed)
Patient ambulatory to triage with steady gait, without difficulty or distress noted, mask in place; pt reports fever & cough today; st recent exposure to flu

## 2018-10-29 ENCOUNTER — Emergency Department
Admission: EM | Admit: 2018-10-29 | Discharge: 2018-10-29 | Disposition: A | Discharge status: Home / Self Care | Payer: BLUE CROSS/BLUE SHIELD | Attending: Emergency Medicine | Admitting: Emergency Medicine

## 2018-10-29 DIAGNOSIS — J101 Influenza due to other identified influenza virus with other respiratory manifestations: Secondary | ICD-10-CM

## 2018-10-29 NOTE — Discharge Instructions (Signed)
You were diagnosed with the flu (influenza).  You will feel ill for as much as a few weeks.  Please take any prescribed medications as instructed, and you may use over-the-counter Tylenol and/or ibuprofen as needed according to label instructions (unless you have an allergy to either or have been told by your doctor not to take them).  Please make sure to drink plenty of fluids and refer to the included information about rehydration.  Follow up with your physician as instructed above, and return to the Emergency Department (ED) if you are unable to tolerate fluids due to vomiting, have worsening trouble breathing, become extremely tired or difficult to awaken, or if you develop any other symptoms that concern you. 

## 2018-10-29 NOTE — ED Provider Notes (Signed)
[facility] Emergency Department Provider Note  ____________________________________________   First MD Initiated Contact with Patient 10/29/18 0354     (approximate)  I have reviewed the triage vital signs and the nursing notes.   HISTORY  Chief Complaint Cough    HPI Marie Sawyer is a 48 y.o. female with no contributory past medical history who presents for evaluation of fever, cough, myalgias, and general malaise starting within the last 24 hours.  She has had sick contacts recently who were diagnosed with the flu and she was concerned she has the flu as well.  Nothing in particular makes her symptoms better or worse and she describes the symptoms as severe.  She is not having any vomiting nor abdominal pain.  She has a mild sore throat and nasal congestion.  Past Medical History:  Diagnosis Date  . Anxiety   . Back pain   . Depression     Patient Active Problem List   Diagnosis Date Noted  . Clinical depression 01/29/2016  . Cephalalgia 01/29/2016  . Enlarged thyroid 01/29/2016  . Midline low back pain 01/29/2016    Past Surgical History:  Procedure Laterality Date  . CESAREAN SECTION    . SHOULDER SURGERY Right   . TUBAL LIGATION      Prior to Admission medications   Medication Sig Start Date End Date Taking? Authorizing Provider  amoxicillin (AMOXIL) 500 MG tablet Take 1 tablet (500 mg total) by mouth 3 (three) times daily. 09/26/17   Triplett, Cari B, FNP  buPROPion (WELLBUTRIN XL) 300 MG 24 hr tablet Take 300 mg by mouth daily.    [provider]  clonazePAM (KLONOPIN) 1 MG tablet Take 1 mg by mouth 2 (two) times daily as needed for anxiety.    [provider]  ibuprofen (ADVIL,MOTRIN) 200 MG tablet Take 200 mg by mouth every 6 (six) hours as needed.    [provider]  magic mouthwash w/lidocaine SOLN Take 10 mLs by mouth 4 (four) times daily as needed for mouth pain. 3ml diphenhydramine; 51ml Maalox;  53ml Lidocaine 09/26/17   Triplett, Cari B, FNP  oxyCODONE-acetaminophen (ROXICET) 5-325 MG tablet Take 1 tablet by mouth every 4 (four) hours as needed for severe pain. 07/11/16   Darci Current, MD  traMADol (ULTRAM) 50 MG tablet Take 1 tablet (50 mg total) by mouth every 6 (six) hours as needed. 04/23/16   Jennye Moccasin, MD  zolpidem (AMBIEN) 10 MG tablet Take 10 mg by mouth at bedtime as needed for sleep.    [provider]    Allergies Patient has no known allergies.  Family History  Problem Relation Age of Onset  . Diabetes Mother   . Liver disease Mother   . Kidney disease Mother   . Hypertension Father     Social History Social History   Tobacco Use  . Smoking status: Never Smoker  . Smokeless tobacco: Never Used  Substance Use Topics  . Alcohol use: No  . Drug use: No    Review of Systems Constitutional: +fever/chills, malaise, fatigue Eyes: No visual changes. ENT: Mild sore throat with nasal congestion. Cardiovascular: Denies chest pain. Respiratory: Cough.  Denies shortness of breath. Gastrointestinal: No abdominal pain.  No nausea, no vomiting.  No diarrhea.  No constipation. Genitourinary: Negative for dysuria. Musculoskeletal: Generalized body aches Integumentary: Negative for rash. Neurological: Negative for headaches, focal weakness or numbness.   ____________________________________________   PHYSICAL EXAM:  VITAL SIGNS: ED Triage Vitals  Enc Vitals Group     BP 10/28/18 2312 (!) 142/78     Pulse Rate 10/28/18 2312 (!) 106     Resp 10/28/18 2312 18     Temp 10/28/18 2312 98.7 F (37.1 C)     Temp Source 10/28/18 2312 Oral     SpO2 10/28/18 2312 98 %     Weight 10/28/18 2310 72.6 kg (160 lb)     Height 10/28/18 2310 1.499 m (4\' 11" )     Head Circumference --      Peak Flow --      Pain Score 10/28/18 2310 0     Pain Loc --      Pain Edu? --      Excl. in GC? --     Constitutional: Alert and oriented.  Patient appears  uncomfortable from a viral illness but is not in acute distress. Eyes: Conjunctivae are normal.  Head: Atraumatic. Nose: No congestion/rhinnorhea. Mouth/Throat: Mucous membranes are moist.  Oropharynx is nonerythematous. Neck: No stridor.  No meningeal signs.   Cardiovascular: Normal rate, regular rhythm. Good peripheral circulation. Grossly normal heart sounds. Respiratory: Normal respiratory effort.  No retractions. Lungs CTAB. Gastrointestinal: Soft and nontender. No distention.  Musculoskeletal: No lower extremity tenderness nor edema. No gross deformities of extremities. Neurologic:  Normal speech and language. No gross focal neurologic deficits are appreciated.  Skin:  Skin is warm, dry and intact. No rash noted. Psychiatric: Mood and affect are normal. Speech and behavior are normal.  ____________________________________________   LABS (all labs ordered are listed, but only abnormal results are displayed)  Labs Reviewed  INFLUENZA PANEL BY PCR (TYPE A & B) - Abnormal; Notable for the following components:      Result Value   Influenza A By PCR POSITIVE (*)    All other components within normal limits   ____________________________________________  EKG  No indication for EKG ____________________________________________  RADIOLOGY   ED MD interpretation: No indication for imaging  Official radiology report(s): No results found.  ____________________________________________   PROCEDURES  Critical Care performed: No   Procedure(s) performed:   Procedures   ____________________________________________   INITIAL IMPRESSION / ASSESSMENT AND PLAN / ED COURSE  As part of my medical decision making, I reviewed the following data within the electronic MEDICAL RECORD NUMBER Nursing notes reviewed and incorporated, Labs reviewed  and Notes from prior ED visits    Signs and symptoms are consistent with viral illness and the patient tested positive for influenza.  No  indication for chest x-ray.  Vital signs are stable and she is afebrile in the emergency department.  I had my usual customary influenza discussion with the patient including why I believe that the risks of Tamiflu outweigh any potential benefits and I gave my usual and customary management recommendations and return precautions.  She understands and agrees with the plan.     ____________________________________________  FINAL CLINICAL IMPRESSION(S) / ED DIAGNOSES  Final diagnoses:  Influenza A     MEDICATIONS GIVEN DURING THIS VISIT:  Medications - No data to display   ED Discharge Orders    None       Note:  This document was prepared using Dragon voice recognition software and may include unintentional dictation errors.   Loleta RoseForbach, Lasandra Batley, MD 10/29/18 [phone]

## 2018-10-29 NOTE — ED Notes (Signed)
Patient complains of body aches and fever yesterday and states that her granddaughter had the flu last week.

## 2018-12-31 ENCOUNTER — Emergency Department
Admission: EM | Admit: 2018-12-31 | Discharge: 2018-12-31 | Disposition: A | Discharge status: Home / Self Care | Payer: BLUE CROSS/BLUE SHIELD | Attending: Emergency Medicine | Admitting: Emergency Medicine

## 2018-12-31 ENCOUNTER — Other Ambulatory Visit: Payer: Self-pay

## 2018-12-31 DIAGNOSIS — Z79899 Other long term (current) drug therapy: Secondary | ICD-10-CM | POA: Insufficient documentation

## 2018-12-31 DIAGNOSIS — N390 Urinary tract infection, site not specified: Secondary | ICD-10-CM | POA: Diagnosis not present

## 2018-12-31 DIAGNOSIS — R300 Dysuria: Secondary | ICD-10-CM | POA: Diagnosis present

## 2018-12-31 LAB — URINALYSIS, COMPLETE (UACMP) WITH MICROSCOPIC
Bacteria, UA: NONE SEEN
Bilirubin Urine: NEGATIVE
Glucose, UA: 50 mg/dL — AB
Ketones, ur: NEGATIVE mg/dL
Nitrite: NEGATIVE
Protein, ur: 100 mg/dL — AB
RBC / HPF: >50 RBC/hpf — ABNORMAL HIGH (ref 0–5)
Specific Gravity, Urine: 1.019 (ref 1.005–1.030)
WBC, UA: >50 WBC/hpf — ABNORMAL HIGH (ref 0–5)
pH: 6 (ref 5.0–8.0)

## 2018-12-31 LAB — POCT PREGNANCY, URINE: Preg Test, Ur: NEGATIVE

## 2018-12-31 MED ORDER — CEPHALEXIN 500 MG PO CAPS
500.0000 mg | ORAL_CAPSULE | Freq: Once | ORAL | Status: AC
  Administered 2018-12-31: 500 mg via ORAL
  Filled 2018-12-31: qty 1

## 2018-12-31 MED ORDER — PHENAZOPYRIDINE HCL 200 MG PO TABS
200.0000 mg | ORAL_TABLET | Freq: Once | ORAL | Status: AC
  Administered 2018-12-31: 200 mg via ORAL
  Filled 2018-12-31: qty 1

## 2018-12-31 MED ORDER — CEPHALEXIN 500 MG PO CAPS: 500.0000 mg | ORAL_CAPSULE | Freq: Three times a day (TID) | ORAL | 0 refills | Status: DC

## 2018-12-31 MED ORDER — PHENAZOPYRIDINE HCL 200 MG PO TABS: 200.0000 mg | ORAL_TABLET | Freq: Three times a day (TID) | ORAL | 0 refills | Status: DC | PRN

## 2018-12-31 NOTE — ED Triage Notes (Signed)
Pt in with co dysuria and urinary urgency. Hx of UTI in the past.

## 2018-12-31 NOTE — ED Provider Notes (Signed)
[facility] Emergency Department Provider Note   ____________________________________________   First MD Initiated Contact with Patient 12/31/18 [phone]     (approximate)  I have reviewed the triage vital signs and the nursing notes.   HISTORY  Chief Complaint Dysuria    HPI Marie Sawyer is a 48 y.o. female who presents to the ED from home with a chief complaint of dysuria and urinary urgency.  History of UTIs in the past.  States tonight she has been unable to sleep because she is getting up and down every few minutes with urinary urgency and dysuria.  Denies associated fever, cough, shortness of breath, abdominal pain, flank pain, nausea or vomiting.  Denies recent travel or trauma.  Denies exposure to persons diagnosed with coronavirus.       Past Medical History:  Diagnosis Date  . Anxiety   . Back pain   . Depression   No history of kidney stones  Patient Active Problem List   Diagnosis Date Noted  . Clinical depression 01/29/2016  . Cephalalgia 01/29/2016  . Enlarged thyroid 01/29/2016  . Midline low back pain 01/29/2016    Past Surgical History:  Procedure Laterality Date  . CESAREAN SECTION    . SHOULDER SURGERY Right   . TUBAL LIGATION      Prior to Admission medications   Medication Sig Start Date End Date Taking? Authorizing Provider  amoxicillin (AMOXIL) 500 MG tablet Take 1 tablet (500 mg total) by mouth 3 (three) times daily. 09/26/17   Triplett, Cari B, FNP  buPROPion (WELLBUTRIN XL) 300 MG 24 hr tablet Take 300 mg by mouth daily.    [provider]  cephALEXin (KEFLEX) 500 MG capsule Take 1 capsule (500 mg total) by mouth 3 (three) times daily. 12/31/18   Irean Hong, MD  clonazePAM (KLONOPIN) 1 MG tablet Take 1 mg by mouth 2 (two) times daily as needed for anxiety.    [provider]  ibuprofen (ADVIL,MOTRIN) 200 MG tablet Take 200 mg by mouth every 6 (six) hours as needed.    [provider]   magic mouthwash w/lidocaine SOLN Take 10 mLs by mouth 4 (four) times daily as needed for mouth pain. 30ml diphenhydramine; 30ml Maalox; 20ml Lidocaine 09/26/17   Triplett, Cari B, FNP  oxyCODONE-acetaminophen (ROXICET) 5-325 MG tablet Take 1 tablet by mouth every 4 (four) hours as needed for severe pain. 07/11/16   Darci Current, MD  phenazopyridine (PYRIDIUM) 200 MG tablet Take 1 tablet (200 mg total) by mouth 3 (three) times daily as needed for pain. 12/31/18   Irean Hong, MD  traMADol (ULTRAM) 50 MG tablet Take 1 tablet (50 mg total) by mouth every 6 (six) hours as needed. 04/23/16   Jennye Moccasin, MD  zolpidem (AMBIEN) 10 MG tablet Take 10 mg by mouth at bedtime as needed for sleep.    [provider]    Allergies Patient has no known allergies.  Family History  Problem Relation Age of Onset  . Diabetes Mother   . Liver disease Mother   . Kidney disease Mother   . Hypertension Father     Social History Social History   Tobacco Use  . Smoking status: Never Smoker  . Smokeless tobacco: Never Used  Substance Use Topics  . Alcohol use: No  . Drug use: No    Review of Systems  Constitutional: No fever/chills Eyes: No visual changes. ENT: No sore throat. Cardiovascular: Denies chest pain. Respiratory:  Denies shortness of breath. Gastrointestinal: No abdominal pain.  No nausea, no vomiting.  No diarrhea.  No constipation. Genitourinary: Positive for dysuria. Musculoskeletal: Negative for back pain. Skin: Negative for rash. Neurological: Negative for headaches, focal weakness or numbness.   ____________________________________________   PHYSICAL EXAM:  VITAL SIGNS: ED Triage Vitals [12/31/18 0227]  Enc Vitals Group     BP 139/80     Pulse Rate 100     Resp 20     Temp 98.8 F (37.1 C)     Temp Source Oral     SpO2 100 %     Weight 165 lb (74.8 kg)     Height 4\' 11"  (1.499 m)     Head Circumference      Peak Flow      Pain Score 6     Pain Loc       Pain Edu?      Excl. in GC?     Constitutional: Alert and oriented. Well appearing and in no acute distress. Eyes: Conjunctivae are normal. PERRL. EOMI. Head: Atraumatic. Nose: No congestion/rhinnorhea. Mouth/Throat: Mucous membranes are moist.  Oropharynx non-erythematous. Neck: No stridor.   Cardiovascular: Normal rate, regular rhythm. Grossly normal heart sounds.  Good peripheral circulation. Respiratory: Normal respiratory effort.  No retractions. Lungs CTAB. Gastrointestinal: Soft and nontender. No distention. No abdominal bruits. No CVA tenderness. Musculoskeletal: No lower extremity tenderness nor edema.  No joint effusions. Neurologic:  Normal speech and language. No gross focal neurologic deficits are appreciated. No gait instability. Skin:  Skin is warm, dry and intact. No rash noted. Psychiatric: Mood and affect are normal. Speech and behavior are normal.  ____________________________________________   LABS (all labs ordered are listed, but only abnormal results are displayed)  Labs Reviewed  URINALYSIS, COMPLETE (UACMP) WITH MICROSCOPIC - Abnormal; Notable for the following components:      Result Value   Color, Urine RED (*)    APPearance CLOUDY (*)    Glucose, UA 50 (*)    Hgb urine dipstick LARGE (*)    Protein, ur 100 (*)    Leukocytes,Ua MODERATE (*)    RBC / HPF >50 (*)    WBC, UA >50 (*)    All other components within normal limits  POC URINE PREG, ED  POCT PREGNANCY, URINE   ____________________________________________  EKG  None ____________________________________________  RADIOLOGY  ED MD interpretation: None  Official radiology report(s): No results found.  ____________________________________________   PROCEDURES  Procedure(s) performed (including Critical Care):  Procedures   ____________________________________________   INITIAL IMPRESSION / ASSESSMENT AND PLAN / ED COURSE  As part of my medical decision making, I  reviewed the following data within the electronic MEDICAL RECORD NUMBER Nursing notes reviewed and incorporated, Labs reviewed, Old chart reviewed and Notes from prior ED visits        48 year old female who presents with dysuria and urgency.  Will obtain urinalysis, consider treating with Pyridium.  Marie Comberracy L Sawyer was evaluated in Emergency Department on 12/31/2018 for the symptoms described in the history of present illness. She was evaluated in the context of the global COVID-19 pandemic, which necessitated consideration that the patient might be at risk for infection with the SARS-CoV-2 virus that causes COVID-19. Institutional protocols and algorithms that pertain to the evaluation of patients at risk for COVID-19 are in a state of rapid change based on information released by regulatory bodies including the CDC and federal and state organizations. These policies and algorithms were followed during the  patient's care in the ED.   Clinical Course as of Dec 31 319  Thu Dec 31, 2018  0319 Updated patient on urinalysis results.  Will start antibiotics, Pyridium and she will follow-up with her PCP as needed.  Strict return precautions given.  Patient verbalizes understanding agrees with plan of care.   [JS]    Clinical Course User Index [JS] Irean Hong, MD     ____________________________________________   FINAL CLINICAL IMPRESSION(S) / ED DIAGNOSES  Final diagnoses:  Dysuria  Lower urinary tract infectious disease     ED Discharge Orders         Ordered    cephALEXin (KEFLEX) 500 MG capsule  3 times daily     12/31/18 0320    phenazopyridine (PYRIDIUM) 200 MG tablet  3 times daily PRN     12/31/18 0320           Note:  This document was prepared using Dragon voice recognition software and may include unintentional dictation errors.   Irean Hong, MD 12/31/18 [phone]

## 2018-12-31 NOTE — Discharge Instructions (Addendum)
1.  Take antibiotic as prescribed (Keflex 500 mg 3 times daily x7 days). 2.  You may take Pyridium as prescribed for painful urination. 3.  Return to the ER for worsening symptoms, persistent vomiting, difficulty breathing or other concerns.

## 2019-02-16 ENCOUNTER — Ambulatory Visit: Payer: BLUE CROSS/BLUE SHIELD | Admitting: Podiatry

## 2019-02-19 ENCOUNTER — Ambulatory Visit: Payer: BLUE CROSS/BLUE SHIELD | Admitting: Podiatry

## 2019-05-21 ENCOUNTER — Emergency Department
Admission: EM | Admit: 2019-05-21 | Discharge: 2019-05-21 | Disposition: A | Discharge status: Home / Self Care | Payer: BLUE CROSS/BLUE SHIELD | Attending: Emergency Medicine | Admitting: Emergency Medicine

## 2019-05-21 ENCOUNTER — Encounter: Payer: Self-pay | Admitting: Emergency Medicine

## 2019-05-21 ENCOUNTER — Emergency Department: Payer: BLUE CROSS/BLUE SHIELD

## 2019-05-21 ENCOUNTER — Other Ambulatory Visit: Payer: Self-pay

## 2019-05-21 DIAGNOSIS — Z79899 Other long term (current) drug therapy: Secondary | ICD-10-CM | POA: Diagnosis not present

## 2019-05-21 DIAGNOSIS — M7732 Calcaneal spur, left foot: Secondary | ICD-10-CM | POA: Diagnosis not present

## 2019-05-21 DIAGNOSIS — M79672 Pain in left foot: Secondary | ICD-10-CM | POA: Diagnosis present

## 2019-05-21 HISTORY — DX: Dizziness and giddiness: R42

## 2019-05-21 HISTORY — DX: Unspecified osteoarthritis, unspecified site: M19.90

## 2019-05-21 MED ORDER — TRAMADOL HCL 50 MG PO TABS: 50.0000 mg | ORAL_TABLET | Freq: Four times a day (QID) | ORAL | 0 refills | Status: DC | PRN

## 2019-05-21 MED ORDER — LIDOCAINE 5 % EX PTCH
1.0000 | MEDICATED_PATCH | CUTANEOUS | Status: DC
  Administered 2019-05-21: 1 via TRANSDERMAL
  Filled 2019-05-21: qty 1

## 2019-05-21 NOTE — Discharge Instructions (Addendum)
Chief complaint needs to be evaluated and treated by podiatrist.

## 2019-05-21 NOTE — ED Triage Notes (Signed)
Pt presents to ED with left ankle pain for the past 3 months. Pain worse tonight. Pt states she has a bone spur in the same foot along with arthritis but pain seems to be worsening in the ankle with swelling noted. Painful with ambulation and palpation. No redness or heat noted.

## 2019-05-21 NOTE — ED Provider Notes (Signed)
[facility] Emergency Department Provider Note   ____________________________________________   First MD Initiated Contact with Patient 05/21/19 [phone]     (approximate)  I have reviewed the triage vital signs and the nursing notes.   HISTORY  Chief Complaint Ankle Pain    HPI Marie Sawyer is a 48 y.o. female patient presents with increasing left heel and medial ankle pain.  Patient states pain is increased in the past 3 months.  Patient has a history of heel spurs diagnosed 1 year ago but did not follow-up with podiatry as advised.  Patient the pain increased with ambulation and weightbearing.  Patient job requires prolonged standing.  Patient rates her pain a 6/10.  Patient described pain is "achy".  Mild trans-relief with anti-inflammatory medications.         Past Medical History:  Diagnosis Date  . Anxiety   . Arthritis   . Back pain   . Depression   . Vertigo     Patient Active Problem List   Diagnosis Date Noted  . Clinical depression 01/29/2016  . Cephalalgia 01/29/2016  . Enlarged thyroid 01/29/2016  . Midline low back pain 01/29/2016    Past Surgical History:  Procedure Laterality Date  . CESAREAN SECTION    . SHOULDER SURGERY Right   . TUBAL LIGATION      Prior to Admission medications   Medication Sig Start Date End Date Taking? Authorizing Provider  amoxicillin (AMOXIL) 500 MG tablet Take 1 tablet (500 mg total) by mouth 3 (three) times daily. 09/26/17   Triplett, Cari B, FNP  buPROPion (WELLBUTRIN XL) 300 MG 24 hr tablet Take 300 mg by mouth daily.    [provider]  cephALEXin (KEFLEX) 500 MG capsule Take 1 capsule (500 mg total) by mouth 3 (three) times daily. 12/31/18   Irean Hong, MD  clonazePAM (KLONOPIN) 1 MG tablet Take 1 mg by mouth 2 (two) times daily as needed for anxiety.    [provider]  ibuprofen (ADVIL,MOTRIN) 200 MG tablet Take 200 mg by mouth every 6 (six) hours as needed.    [provider]  magic mouthwash w/lidocaine SOLN Take 10 mLs by mouth 4 (four) times daily as needed for mouth pain. 14ml diphenhydramine; 10ml Maalox; 5ml Lidocaine 09/26/17   Triplett, Cari B, FNP  oxyCODONE-acetaminophen (ROXICET) 5-325 MG tablet Take 1 tablet by mouth every 4 (four) hours as needed for severe pain. 07/11/16   Darci Current, MD  phenazopyridine (PYRIDIUM) 200 MG tablet Take 1 tablet (200 mg total) by mouth 3 (three) times daily as needed for pain. 12/31/18   Irean Hong, MD  traMADol (ULTRAM) 50 MG tablet Take 1 tablet (50 mg total) by mouth every 6 (six) hours as needed. 04/23/16   Jennye Moccasin, MD  traMADol (ULTRAM) 50 MG tablet Take 1 tablet (50 mg total) by mouth every 6 (six) hours as needed. 05/21/19 05/20/20  Joni Reining, PA-C  zolpidem (AMBIEN) 10 MG tablet Take 10 mg by mouth at bedtime as needed for sleep.    [provider]    Allergies Patient has no known allergies.  Family History  Problem Relation Age of Onset  . Diabetes Mother   . Liver disease Mother   . Kidney disease Mother   . Hypertension Father     Social History Social History   Tobacco Use  . Smoking status: Never Smoker  . Smokeless tobacco: Never Used  Substance Use Topics  .  Alcohol use: Yes  . Drug use: No    Review of Systems  Constitutional: No fever/chills Eyes: No visual changes. ENT: No sore throat. Cardiovascular: Denies chest pain. Respiratory: Denies shortness of breath. Gastrointestinal: No abdominal pain.  No nausea, no vomiting.  No diarrhea.  No constipation. Genitourinary: Negative for dysuria. Musculoskeletal: Left plantar heel pain. Skin: Negative for rash. Neurological: Negative for headaches, focal weakness or numbness. Psychiatric:  Anxiety and depression. }  ____________________________________________   PHYSICAL EXAM:  VITAL SIGNS: ED Triage Vitals  Enc Vitals Group     BP 05/21/19 0441 (!) 128/91     Pulse Rate 05/21/19 0441 81      Resp 05/21/19 0441 16     Temp 05/21/19 0441 98.5 F (36.9 C)     Temp Source 05/21/19 0441 Oral     SpO2 05/21/19 0441 100 %     Weight 05/21/19 0441 165 lb (74.8 kg)     Height 05/21/19 0441 4\' 11"  (1.499 m)     Head Circumference --      Peak Flow --      Pain Score 05/21/19 0445 6     Pain Loc --      Pain Edu? --      Excl. in [place]? --    Constitutional: Alert and oriented. Well appearing and in no acute distress. Cardiovascular: Normal rate, regular rhythm. Grossly normal heart sounds.  Good peripheral circulation. Respiratory: Normal respiratory effort.  No retractions. Lungs CTAB. Musculoskeletal: No obvious deformity to the right ankle.  Patient is moderate guarding palpation of plantar aspect of the calcaneus.  Atypical gait with ambulation. Neurologic:  Normal speech and language. No gross focal neurologic deficits are appreciated. No gait instability. Skin:  Skin is warm, dry and intact. No rash noted. Psychiatric: Mood and affect are normal. Speech and behavior are normal.  ____________________________________________   LABS (all labs ordered are listed, but only abnormal results are displayed)  Labs Reviewed - No data to display ____________________________________________  EKG   ____________________________________________  RADIOLOGY  ED MD interpretation:    Official radiology report(s): Dg Ankle Complete Left  Result Date: 05/21/2019 CLINICAL DATA:  Ankle pain for 3 months, worse tonight. EXAM: LEFT ANKLE COMPLETE - 3+ VIEW COMPARISON:  [id number] foot radiograph FINDINGS: There is no evidence of fracture, dislocation, or joint effusion. Stable plantar heel spur. IMPRESSION: 1. No emergent finding. 2. Heel spur that is stable from 2019. Electronically Signed   By: Monte Fantasia M.D.   On: 05/21/2019 05:33    ____________________________________________   PROCEDURES  Procedure(s) performed (including Critical Care):  Procedures    ____________________________________________   INITIAL IMPRESSION / ASSESSMENT AND PLAN / ED COURSE  As part of my medical decision making, I reviewed the following data within the [place] was evaluated in Emergency Department on 05/21/2019 for the symptoms described in the history of present illness. She was evaluated in the context of the global COVID-19 pandemic, which necessitated consideration that the patient might be at risk for infection with the SARS-CoV-2 virus that causes COVID-19. Institutional protocols and algorithms that pertain to the evaluation of patients at risk for COVID-19 are in a state of rapid change based on information released by regulatory bodies including the CDC and federal and state organizations. These policies and algorithms were followed during the patient's care in the ED.  Patient presents with increasing left heel pain secondary to  a bone spur.  Discussed x-ray findings with patient.  Patient given discharge care instruction advised follow-up with podiatry for definitive evaluation and treatment.      ____________________________________________   FINAL CLINICAL IMPRESSION(S) / ED DIAGNOSES  Final diagnoses:  Heel spur, left     ED Discharge Orders         Ordered    traMADol (ULTRAM) 50 MG tablet  Every 6 hours PRN     05/21/19 0728           Note:  This document was prepared using Dragon voice recognition software and may include unintentional dictation errors.    Joni ReiningSmith, Lennie Vasco K, PA-C 05/21/19 [phone]    Shaune PollackIsaacs, [place], MD 05/21/19 [phone]

## 2019-09-19 DIAGNOSIS — R109 Unspecified abdominal pain: Secondary | ICD-10-CM | POA: Diagnosis present

## 2019-09-19 DIAGNOSIS — Z5321 Procedure and treatment not carried out due to patient leaving prior to being seen by health care provider: Secondary | ICD-10-CM | POA: Insufficient documentation

## 2019-09-19 LAB — COMPREHENSIVE METABOLIC PANEL
ALT: 20 U/L (ref 0–44)
AST: 28 U/L (ref 15–41)
Albumin: 3.6 g/dL (ref 3.5–5.0)
Alkaline Phosphatase: 55 U/L (ref 38–126)
Anion gap: 10 (ref 5–15)
BUN: 11 mg/dL (ref 6–20)
CO2: 22 mmol/L (ref 22–32)
Calcium: 9.1 mg/dL (ref 8.9–10.3)
Chloride: 104 mmol/L (ref 98–111)
Creatinine, Ser: 0.7 mg/dL (ref 0.44–1.00)
GFR calc Af Amer: >60 mL/min (ref >60)
GFR calc non Af Amer: >60 mL/min (ref >60)
Glucose, Bld: 209 mg/dL — ABNORMAL HIGH (ref 70–99)
Potassium: 3.8 mmol/L (ref 3.5–5.1)
Sodium: 136 mmol/L (ref 135–145)
Total Bilirubin: 0.5 mg/dL (ref 0.3–1.2)
Total Protein: 7.5 g/dL (ref 6.5–8.1)

## 2019-09-19 LAB — CBC
HCT: 36.4 % (ref 36.0–46.0)
Hemoglobin: 12.9 g/dL (ref 12.0–15.0)
MCH: 28.4 pg (ref 26.0–34.0)
MCHC: 35.4 g/dL (ref 30.0–36.0)
MCV: 80.2 fL (ref 80.0–100.0)
Platelets: 286 10*3/uL (ref 150–400)
RBC: 4.54 MIL/uL (ref 3.87–5.11)
RDW: 13 % (ref 11.5–15.5)
WBC: 8.6 10*3/uL (ref 4.0–10.5)
nRBC: 0 % (ref 0.0–0.2)

## 2019-09-19 LAB — URINALYSIS, COMPLETE (UACMP) WITH MICROSCOPIC
Bilirubin Urine: NEGATIVE
Glucose, UA: 50 mg/dL — AB
Hgb urine dipstick: NEGATIVE
Ketones, ur: NEGATIVE mg/dL
Leukocytes,Ua: NEGATIVE
Nitrite: NEGATIVE
Protein, ur: NEGATIVE mg/dL
Specific Gravity, Urine: 1.026 (ref 1.005–1.030)
pH: 5 (ref 5.0–8.0)

## 2019-09-19 LAB — POCT PREGNANCY, URINE: Preg Test, Ur: NEGATIVE

## 2019-09-19 NOTE — ED Triage Notes (Signed)
Patient to ED for a month of abdominal pain. When asked what region of the abdomen she is hurting she squeezes the entire abdomen and pushes it towards her umbilical area. States she needs to make sure there isn't anything that is going to "burst or kill her." Patient is alert and oriented, able to speak in complete sentences without obvious respiratory distress. Patient is on her phone during triage.

## 2019-09-20 ENCOUNTER — Emergency Department
Admission: EM | Admit: 2019-09-20 | Discharge: 2019-09-20 | Disposition: A | Discharge status: Home / Self Care | Payer: BLUE CROSS/BLUE SHIELD | Attending: Emergency Medicine | Admitting: Emergency Medicine

## 2019-09-20 NOTE — ED Notes (Signed)
Pt not seen in lobby, no answer.

## 2019-09-20 NOTE — ED Notes (Signed)
Pt not seen in lobby x3.

## 2019-11-25 ENCOUNTER — Encounter: Payer: Self-pay | Admitting: Emergency Medicine

## 2019-11-25 ENCOUNTER — Emergency Department
Admission: EM | Admit: 2019-11-25 | Discharge: 2019-11-25 | Disposition: A | Discharge status: Home / Self Care | Payer: BLUE CROSS/BLUE SHIELD | Attending: Emergency Medicine | Admitting: Emergency Medicine

## 2019-11-25 DIAGNOSIS — R030 Elevated blood-pressure reading, without diagnosis of hypertension: Secondary | ICD-10-CM | POA: Insufficient documentation

## 2019-11-25 DIAGNOSIS — Z79899 Other long term (current) drug therapy: Secondary | ICD-10-CM | POA: Diagnosis not present

## 2019-11-25 DIAGNOSIS — N3090 Cystitis, unspecified without hematuria: Secondary | ICD-10-CM | POA: Diagnosis not present

## 2019-11-25 DIAGNOSIS — R300 Dysuria: Secondary | ICD-10-CM | POA: Diagnosis present

## 2019-11-25 LAB — URINALYSIS, COMPLETE (UACMP) WITH MICROSCOPIC
Bacteria, UA: NONE SEEN
Specific Gravity, Urine: 1.026 (ref 1.005–1.030)
Squamous Epithelial / LPF: NONE SEEN (ref 0–5)

## 2019-11-25 LAB — COMPREHENSIVE METABOLIC PANEL
ALT: 18 U/L (ref 0–44)
AST: 21 U/L (ref 15–41)
Albumin: 3.9 g/dL (ref 3.5–5.0)
Alkaline Phosphatase: 71 U/L (ref 38–126)
Anion gap: 8 (ref 5–15)
BUN: 10 mg/dL (ref 6–20)
CO2: 26 mmol/L (ref 22–32)
Calcium: 9.4 mg/dL (ref 8.9–10.3)
Chloride: 101 mmol/L (ref 98–111)
Creatinine, Ser: 0.7 mg/dL (ref 0.44–1.00)
GFR calc Af Amer: >60 mL/min (ref >60)
GFR calc non Af Amer: >60 mL/min (ref >60)
Glucose, Bld: 161 mg/dL — ABNORMAL HIGH (ref 70–99)
Potassium: 3.8 mmol/L (ref 3.5–5.1)
Sodium: 135 mmol/L (ref 135–145)
Total Bilirubin: 0.5 mg/dL (ref 0.3–1.2)
Total Protein: 7.6 g/dL (ref 6.5–8.1)

## 2019-11-25 LAB — CBC
HCT: 40 % (ref 36.0–46.0)
Hemoglobin: 13.1 g/dL (ref 12.0–15.0)
MCH: 27.5 pg (ref 26.0–34.0)
MCHC: 32.8 g/dL (ref 30.0–36.0)
MCV: 83.9 fL (ref 80.0–100.0)
Platelets: 413 10*3/uL — ABNORMAL HIGH (ref 150–400)
RBC: 4.77 MIL/uL (ref 3.87–5.11)
RDW: 12.5 % (ref 11.5–15.5)
WBC: 12.3 10*3/uL — ABNORMAL HIGH (ref 4.0–10.5)
nRBC: 0 % (ref 0.0–0.2)

## 2019-11-25 LAB — LIPASE, BLOOD: Lipase: 33 U/L (ref 11–51)

## 2019-11-25 MED ORDER — KETOROLAC TROMETHAMINE 30 MG/ML IJ SOLN
30.0000 mg | Freq: Once | INTRAMUSCULAR | Status: AC
  Administered 2019-11-25: 22:00:00 30 mg via INTRAMUSCULAR
  Filled 2019-11-25: qty 1

## 2019-11-25 MED ORDER — CEPHALEXIN 500 MG PO CAPS: 500.0000 mg | ORAL_CAPSULE | Freq: Three times a day (TID) | ORAL | 0 refills | Status: AC

## 2019-11-25 NOTE — ED Notes (Signed)
Pt to the er for pain and urgency with urination. Pt has a hx of uti. Pt has been taking azo with no relief.

## 2019-11-25 NOTE — ED Provider Notes (Signed)
Emergency Department Provider Note  ____________________________________________  Time seen: Approximately 9:31 PM  I have reviewed the triage vital signs and the nursing notes.   HISTORY  Chief Complaint Dysuria   Historian Patient     HPI Marie Sawyer is a 49 y.o. female presents to the emergency department with dysuria and increased urinary frequency for the past 2 to 3 days.  Patient reports that she has been taking Azo but it has not been relieving her symptoms.  She denies low back pain, nausea, vomiting or abdominal pain.  No changes in vaginal discharge or dyspareunia.  Patient reports that she has had a history of urinary tract infections over the past 2 to 3 years and her symptoms feel similar.  No fever or chills at home.   Past Medical History:  Diagnosis Date  . Anxiety   . Arthritis   . Back pain   . Depression   . Vertigo      Immunizations up to date:  Yes.     Past Medical History:  Diagnosis Date  . Anxiety   . Arthritis   . Back pain   . Depression   . Vertigo     Patient Active Problem List   Diagnosis Date Noted  . Clinical depression 01/29/2016  . Cephalalgia 01/29/2016  . Enlarged thyroid 01/29/2016  . Midline low back pain 01/29/2016    Past Surgical History:  Procedure Laterality Date  . CESAREAN SECTION    . SHOULDER SURGERY Right   . TUBAL LIGATION      Prior to Admission medications   Medication Sig Start Date End Date Taking? Authorizing Provider  amoxicillin (AMOXIL) 500 MG tablet Take 1 tablet (500 mg total) by mouth 3 (three) times daily. 09/26/17   Triplett, Cari B, FNP  buPROPion (WELLBUTRIN XL) 300 MG 24 hr tablet Take 300 mg by mouth daily.    [provider]  cephALEXin (KEFLEX) 500 MG capsule Take 1 capsule (500 mg total) by mouth 3 (three) times daily for 7 days. 11/25/19 12/02/19  Orvil Feil, PA-C  clonazePAM (KLONOPIN) 1 MG tablet Take 1 mg by mouth 2 (two) times daily as needed for anxiety.     [provider]  ibuprofen (ADVIL,MOTRIN) 200 MG tablet Take 200 mg by mouth every 6 (six) hours as needed.    [provider]  magic mouthwash w/lidocaine SOLN Take 10 mLs by mouth 4 (four) times daily as needed for mouth pain. 24ml diphenhydramine; 5ml Maalox; 93ml Lidocaine 09/26/17   Triplett, Cari B, FNP  oxyCODONE-acetaminophen (ROXICET) 5-325 MG tablet Take 1 tablet by mouth every 4 (four) hours as needed for severe pain. 07/11/16   Darci Current, MD  phenazopyridine (PYRIDIUM) 200 MG tablet Take 1 tablet (200 mg total) by mouth 3 (three) times daily as needed for pain. 12/31/18   Irean Hong, MD  traMADol (ULTRAM) 50 MG tablet Take 1 tablet (50 mg total) by mouth every 6 (six) hours as needed. 04/23/16   Jennye Moccasin, MD  traMADol (ULTRAM) 50 MG tablet Take 1 tablet (50 mg total) by mouth every 6 (six) hours as needed. 05/21/19 05/20/20  Joni Reining, PA-C  zolpidem (AMBIEN) 10 MG tablet Take 10 mg by mouth at bedtime as needed for sleep.    [provider]    Allergies Patient has no known allergies.  Family History  Problem Relation Age of Onset  . Diabetes Mother   . Liver disease Mother   .  Kidney disease Mother   . Hypertension Father     Social History Social History   Tobacco Use  . Smoking status: Never Smoker  . Smokeless tobacco: Never Used  Substance Use Topics  . Alcohol use: Yes  . Drug use: No     Review of Systems  Constitutional: No fever/chills Eyes:  No discharge ENT: No upper respiratory complaints. Respiratory: no cough. No SOB/ use of accessory muscles to breath Gastrointestinal:   No nausea, no vomiting.  No diarrhea.  No constipation. Genitourinary: Patient has dysuria.  Musculoskeletal: Negative for musculoskeletal pain. Skin: Negative for rash, abrasions, lacerations, ecchymosis.   ____________________________________________   PHYSICAL EXAM:  VITAL SIGNS: ED Triage Vitals [11/25/19 1923]  Enc Vitals  Group     BP (!) 165/97     Pulse Rate 96     Resp 17     Temp 98.6 F (37 C)     Temp Source Oral     SpO2 97 %     Weight      Height      Head Circumference      Peak Flow      Pain Score      Pain Loc      Pain Edu?      Excl. in GC?      Constitutional: Alert and oriented. Well appearing and in no acute distress. Eyes: Conjunctivae are normal. PERRL. EOMI. Head: Atraumatic. ENT:      Ears: TMs are pearly.       Nose: No congestion/rhinnorhea.      Mouth/Throat: Mucous membranes are moist.  Neck: No stridor.  No cervical spine tenderness to palpation. Cardiovascular: Normal rate, regular rhythm. Normal S1 and S2.  Good peripheral circulation. Respiratory: Normal respiratory effort without tachypnea or retractions. Lungs CTAB. Good air entry to the bases with no decreased or absent breath sounds Gastrointestinal: Bowel sounds x 4 quadrants.  Patient has suprapubic tenderness to palpation.  No guarding or rigidity. No distention. Musculoskeletal: Full range of motion to all extremities. No obvious deformities noted Neurologic:  Normal for age. No gross focal neurologic deficits are appreciated.  Skin:  Skin is warm, dry and intact. No rash noted. Psychiatric: Mood and affect are normal for age. Speech and behavior are normal.   ____________________________________________   LABS (all labs ordered are listed, but only abnormal results are displayed)  Labs Reviewed  COMPREHENSIVE METABOLIC PANEL - Abnormal; Notable for the following components:      Result Value   Glucose, Bld 161 (*)    All other components within normal limits  CBC - Abnormal; Notable for the following components:   WBC 12.3 (*)    Platelets 413 (*)    All other components within normal limits  URINALYSIS, COMPLETE (UACMP) WITH MICROSCOPIC - Abnormal; Notable for the following components:   Color, Urine RED (*)    APPearance CLOUDY (*)    Glucose, UA   (*)    Value: TEST NOT REPORTED DUE TO  COLOR INTERFERENCE OF URINE PIGMENT   Hgb urine dipstick   (*)    Value: TEST NOT REPORTED DUE TO COLOR INTERFERENCE OF URINE PIGMENT   Bilirubin Urine   (*)    Value: TEST NOT REPORTED DUE TO COLOR INTERFERENCE OF URINE PIGMENT   Ketones, ur   (*)    Value: TEST NOT REPORTED DUE TO COLOR INTERFERENCE OF URINE PIGMENT   Protein, ur   (*)    Value: TEST NOT  REPORTED DUE TO COLOR INTERFERENCE OF URINE PIGMENT   Nitrite   (*)    Value: TEST NOT REPORTED DUE TO COLOR INTERFERENCE OF URINE PIGMENT   Leukocytes,Ua   (*)    Value: TEST NOT REPORTED DUE TO COLOR INTERFERENCE OF URINE PIGMENT   All other components within normal limits  LIPASE, BLOOD  POC URINE PREG, ED   ____________________________________________  EKG   ____________________________________________  RADIOLOGY   No results found.  ____________________________________________    PROCEDURES  Procedure(s) performed:     Procedures     Medications  ketorolac (TORADOL) 30 MG/ML injection 30 mg (has no administration in time range)     ____________________________________________   INITIAL IMPRESSION / ASSESSMENT AND PLAN / ED COURSE  Pertinent labs & imaging results that were available during my care of the patient were reviewed by me and considered in my medical decision making (see chart for details).      Assessment and plan Cystitis 49 year old female presents to the emergency department with dysuria and increased urinary frequency over the past 2 to 3 days.  Patient was hypertensive at triage but vital signs were otherwise reassuring.  Patient had no CVA tenderness but did have suprapubic tenderness to palpation.  Patient was treated empirically for cystitis with Keflex.  Toradol was administered prior to discharge.  Return precautions were given to return with new or worsening symptoms.  All patient questions were answered.  ____________________________________________  FINAL CLINICAL  IMPRESSION(S) / ED DIAGNOSES  Final diagnoses:  Cystitis      NEW MEDICATIONS STARTED DURING THIS VISIT:  ED Discharge Orders         Ordered    cephALEXin (KEFLEX) 500 MG capsule  3 times daily     11/25/19 2127              This chart was dictated using voice recognition software/Dragon. Despite best efforts to proofread, errors can occur which can change the meaning. Any change was purely unintentional.     Lannie Fields, PA-C 11/25/19 2135    Harvest Dark, MD 11/25/19 2249

## 2019-11-25 NOTE — ED Triage Notes (Signed)
Pt reports she has had urinary frequency and dysuria x4 days with out relief by AZO. Pt denies fever.

## 2020-02-07 ENCOUNTER — Encounter: Payer: Self-pay | Admitting: Emergency Medicine

## 2020-02-07 ENCOUNTER — Emergency Department: Payer: BLUE CROSS/BLUE SHIELD

## 2020-02-07 ENCOUNTER — Emergency Department
Admission: EM | Admit: 2020-02-07 | Discharge: 2020-02-07 | Disposition: A | Discharge status: Home / Self Care | Payer: BLUE CROSS/BLUE SHIELD | Attending: Emergency Medicine | Admitting: Emergency Medicine

## 2020-02-07 ENCOUNTER — Other Ambulatory Visit: Payer: Self-pay

## 2020-02-07 DIAGNOSIS — J040 Acute laryngitis: Secondary | ICD-10-CM | POA: Insufficient documentation

## 2020-02-07 DIAGNOSIS — J069 Acute upper respiratory infection, unspecified: Secondary | ICD-10-CM | POA: Diagnosis not present

## 2020-02-07 DIAGNOSIS — R519 Headache, unspecified: Secondary | ICD-10-CM | POA: Diagnosis present

## 2020-02-07 LAB — GLUCOSE, CAPILLARY: Glucose-Capillary: 105 mg/dL — ABNORMAL HIGH (ref 70–99)

## 2020-02-07 LAB — GROUP A STREP BY PCR: Group A Strep by PCR: NOT DETECTED

## 2020-02-07 MED ORDER — AZITHROMYCIN 500 MG PO TABS
500.0000 mg | ORAL_TABLET | Freq: Once | ORAL | Status: AC
  Administered 2020-02-07: 500 mg via ORAL
  Filled 2020-02-07: qty 1

## 2020-02-07 MED ORDER — DEXAMETHASONE 10 MG/ML FOR PEDIATRIC ORAL USE
10.0000 mg | Freq: Once | INTRAMUSCULAR | Status: AC
  Administered 2020-02-07: 10 mg via ORAL
  Filled 2020-02-07: qty 1

## 2020-02-07 MED ORDER — ALBUTEROL SULFATE HFA 108 (90 BASE) MCG/ACT IN AERS: 2.0000 | INHALATION_SPRAY | Freq: Four times a day (QID) | RESPIRATORY_TRACT | 0 refills | Status: DC | PRN

## 2020-02-07 MED ORDER — AZITHROMYCIN 250 MG PO TABS: ORAL_TABLET | ORAL | 0 refills | Status: DC

## 2020-02-07 MED ORDER — LIDOCAINE VISCOUS HCL 2 % MT SOLN: 10.0000 mL | OROMUCOSAL | 0 refills | Status: DC | PRN

## 2020-02-07 MED ORDER — LIDOCAINE VISCOUS HCL 2 % MT SOLN
15.0000 mL | Freq: Once | OROMUCOSAL | Status: AC
  Administered 2020-02-07: 15 mL via OROMUCOSAL
  Filled 2020-02-07: qty 15

## 2020-02-07 NOTE — ED Notes (Signed)
POC blood glucose 105.

## 2020-02-07 NOTE — ED Triage Notes (Signed)
C/O sore throat x 1 week also intermittent laryngitis.  Also cough and sinus congestion.

## 2020-02-07 NOTE — ED Provider Notes (Signed)
[facility] Emergency Department Provider Note  ____________________________________________  Time seen: Approximately 4:27 PM  I have reviewed the triage vital signs and the nursing notes.   HISTORY  Chief Complaint URI    HPI Marie Sawyer is a 49 y.o. female that presents to the emergency department for evaluation of headache, nasal congestion, sore throat, hoarse voice, intermittent cough for 1 week.  Patient is unsure if she is getting a "head cold."  Patient states that her friend is also sick with a cold.  She does not want a Covid test at this time.  She does not smoke.  Patient has diabetes and states that her blood sugars are not well controlled.  No fevers, shortness of breath, vomiting, abdominal pain.  Past Medical History:  Diagnosis Date  . Anxiety   . Arthritis   . Back pain   . Depression   . Vertigo     Patient Active Problem List   Diagnosis Date Noted  . Clinical depression 01/29/2016  . Cephalalgia 01/29/2016  . Enlarged thyroid 01/29/2016  . Midline low back pain 01/29/2016    Past Surgical History:  Procedure Laterality Date  . CESAREAN SECTION    . SHOULDER SURGERY Right   . TUBAL LIGATION      Prior to Admission medications   Medication Sig Start Date End Date Taking? Authorizing Provider  albuterol (VENTOLIN HFA) 108 (90 Base) MCG/ACT inhaler Inhale 2 puffs into the lungs every 6 (six) hours as needed for wheezing or shortness of breath. 02/07/20   Enid Derry, PA-C  azithromycin (ZITHROMAX Z-PAK) 250 MG tablet Take 2 tablets (500 mg) on  Day 1,  followed by 1 tablet (250 mg) once daily on Days 2 through 5. 02/08/20   Enid Derry, PA-C  buPROPion (WELLBUTRIN XL) 300 MG 24 hr tablet Take 300 mg by mouth daily.    [provider]  clonazePAM (KLONOPIN) 1 MG tablet Take 1 mg by mouth 2 (two) times daily as needed for anxiety.    [provider]  lidocaine (XYLOCAINE) 2 % solution Use as directed 10  mLs in the mouth or throat as needed. 02/07/20   Enid Derry, PA-C  zolpidem (AMBIEN) 10 MG tablet Take 10 mg by mouth at bedtime as needed for sleep.    [provider]    Allergies Patient has no known allergies.  Family History  Problem Relation Age of Onset  . Diabetes Mother   . Liver disease Mother   . Kidney disease Mother   . Hypertension Father     Social History Social History   Tobacco Use  . Smoking status: Never Smoker  . Smokeless tobacco: Never Used  Substance Use Topics  . Alcohol use: Yes  . Drug use: No     Review of Systems  Constitutional: No fever/chills Eyes: No visual changes. No discharge. ENT: Positive for congestion and rhinorrhea.  Positive for sore throat. Cardiovascular: No chest pain. Respiratory: Positive for cough. No SOB. Gastrointestinal: No abdominal pain.  No nausea, no vomiting.  No diarrhea.  No constipation. Musculoskeletal: Negative for musculoskeletal pain. Skin: Negative for rash, abrasions, lacerations, ecchymosis. Neurological: Positive for headache.   ____________________________________________   PHYSICAL EXAM:  VITAL SIGNS: ED Triage Vitals  Enc Vitals Group     BP 02/07/20 1614 (!) 141/84     Pulse Rate 02/07/20 1614 87     Resp 02/07/20 1614 16     Temp 02/07/20 1614 98.5 F (36.9 C)  Temp Source 02/07/20 1614 Oral     SpO2 02/07/20 1614 98 %     Weight 02/07/20 1613 164 lb 14.5 oz (74.8 kg)     Height 02/07/20 1613 4\' 11"  (1.499 m)     Head Circumference --      Peak Flow --      Pain Score 02/07/20 1612 6     Pain Loc --      Pain Edu? --      Excl. in GC? --      Constitutional: Alert and oriented. Well appearing and in no acute distress. Eyes: Conjunctivae are normal. PERRL. EOMI. No discharge. Head: Atraumatic. ENT: No frontal and maxillary sinus tenderness.      Ears: Tympanic membranes pearly gray with good landmarks. No discharge.      Nose: Mild congestion/rhinnorhea.       Mouth/Throat: Mucous membranes are moist. Oropharynx erythematous. Tonsils not enlarged. No exudates. Uvula midline.  Hoarse voice. Neck: No stridor.   Hematological/Lymphatic/Immunilogical: No cervical lymphadenopathy. Cardiovascular: Normal rate, regular rhythm.  Good peripheral circulation. Respiratory: Normal respiratory effort without tachypnea or retractions. Lungs CTAB. Good air entry to the bases with no decreased or absent breath sounds. Gastrointestinal: Bowel sounds 4 quadrants. Soft and nontender to palpation. No guarding or rigidity. No palpable masses. No distention. Musculoskeletal: Full range of motion to all extremities. No gross deformities appreciated. Neurologic:  Normal speech and language. No gross focal neurologic deficits are appreciated.  Skin:  Skin is warm, dry and intact. No rash noted. Psychiatric: Mood and affect are normal. Speech and behavior are normal. Patient exhibits appropriate insight and judgement.   ____________________________________________   LABS (all labs ordered are listed, but only abnormal results are displayed)  Labs Reviewed  GLUCOSE, CAPILLARY - Abnormal; Notable for the following components:      Result Value   Glucose-Capillary 105 (*)    All other components within normal limits  GROUP A STREP BY PCR  CBG MONITORING, ED   ____________________________________________  EKG   ____________________________________________  RADIOLOGY   DG Chest 1 View  Result Date: 02/07/2020 CLINICAL DATA:  Cough. Sore throat and sinus congestion. EXAM: CHEST  1 VIEW COMPARISON:  06/24/2016 FINDINGS: The cardiomediastinal contours are normal. Mild bronchial thickening. Pulmonary vasculature is normal. No consolidation, pleural effusion, or pneumothorax. No acute osseous abnormalities are seen. IMPRESSION: Mild bronchial thickening suggesting bronchitis or asthma. Electronically Signed   By: 08/24/2016 M.D.   On: 02/07/2020 16:42     ____________________________________________    PROCEDURES  Procedure(s) performed:    Procedures    Medications  dexamethasone (DECADRON) 10 MG/ML injection for Pediatric ORAL use 10 mg (has no administration in time range)  lidocaine (XYLOCAINE) 2 % viscous mouth solution 15 mL (15 mLs Mouth/Throat Given 02/07/20 1826)  azithromycin (ZITHROMAX) tablet 500 mg (500 mg Oral Given 02/07/20 1826)     ____________________________________________   INITIAL IMPRESSION / ASSESSMENT AND PLAN / ED COURSE  Pertinent labs & imaging results that were available during my care of the patient were reviewed by me and considered in my medical decision making (see chart for details).  Review of the [place] CSRS was performed in accordance of the [facility] prior to dispensing any controlled drugs.   Patient's diagnosis is consistent with laryngitis and URI. Vital signs and exam are reassuring.  Chest x-ray consistent with mild bronchial thickening.  Patient was given a dose of azithromycin to cover for bacterial infection and Decadron for inflammation in the emergency  department.  Patient appears well and is staying well hydrated.  Patient feels comfortable going home. Patient will be discharged home with prescriptions for azithromycin, albuterol inhaler, and viscous lidocaine. Patient is to follow up with primary care as needed or otherwise directed. Patient is given ED precautions to return to the ED for any worsening or new symptoms.  Marie Sawyer was evaluated in Emergency Department on 02/07/2020 for the symptoms described in the history of present illness. She was evaluated in the context of the global COVID-19 pandemic, which necessitated consideration that the patient might be at risk for infection with the SARS-CoV-2 virus that causes COVID-19. Institutional protocols and algorithms that pertain to the evaluation of patients at risk for COVID-19 are in a state of rapid change based on information  released by regulatory bodies including the CDC and federal and state organizations. These policies and algorithms were followed during the patient's care in the ED.   ____________________________________________  FINAL CLINICAL IMPRESSION(S) / ED DIAGNOSES  Final diagnoses:  Laryngitis  Upper respiratory tract infection, unspecified type      NEW MEDICATIONS STARTED DURING THIS VISIT:  ED Discharge Orders         Ordered    azithromycin (ZITHROMAX Z-PAK) 250 MG tablet     02/07/20 1855    lidocaine (XYLOCAINE) 2 % solution  As needed     02/07/20 1855    albuterol (VENTOLIN HFA) 108 (90 Base) MCG/ACT inhaler  Every 6 hours PRN     02/07/20 1920              This chart was dictated using voice recognition software/Dragon. Despite best efforts to proofread, errors can occur which can change the meaning. Any change was purely unintentional.    Laban Emperor, PA-C 02/08/20 1518    Harvest Dark, MD 02/09/20 [phone]

## 2020-02-07 NOTE — ED Notes (Signed)
See triage note  Presents with about 1 weeks hx of sore throat and h/a  Also has had intermittent hoarseness afebrile on arrival

## 2020-02-07 NOTE — ED Notes (Signed)
Pt discharged home after verbalizing understanding of discharge instructions; nad noted. 

## 2020-02-24 ENCOUNTER — Other Ambulatory Visit: Payer: Self-pay

## 2020-02-24 ENCOUNTER — Emergency Department
Admission: EM | Admit: 2020-02-24 | Discharge: 2020-02-24 | Disposition: A | Discharge status: Home / Self Care | Payer: BLUE CROSS/BLUE SHIELD | Attending: Emergency Medicine | Admitting: Emergency Medicine

## 2020-02-24 DIAGNOSIS — J029 Acute pharyngitis, unspecified: Secondary | ICD-10-CM | POA: Diagnosis present

## 2020-02-24 DIAGNOSIS — Z5321 Procedure and treatment not carried out due to patient leaving prior to being seen by health care provider: Secondary | ICD-10-CM | POA: Insufficient documentation

## 2020-02-24 LAB — GROUP A STREP BY PCR: Group A Strep by PCR: NOT DETECTED

## 2020-02-24 NOTE — ED Triage Notes (Signed)
Pt arrives to ED via POV from home with c/o left-sided sore throat x2 weeks. Pt reports being seen for same recently and told to return if s/x's failed to improve. Pt reports taking r/x'd medications without relief. Pt denies any c/o trouble breathing or swallowing. No c/o's N/V/D or fever.Pt is A&O, in NAD; RR even, regular, and unlabored.

## 2020-02-25 ENCOUNTER — Other Ambulatory Visit: Payer: Self-pay

## 2020-02-25 ENCOUNTER — Encounter: Payer: Self-pay | Admitting: Physician Assistant

## 2020-02-25 ENCOUNTER — Emergency Department
Admission: EM | Admit: 2020-02-25 | Discharge: 2020-02-25 | Disposition: A | Discharge status: Home / Self Care | Payer: BLUE CROSS/BLUE SHIELD | Attending: Emergency Medicine | Admitting: Emergency Medicine

## 2020-02-25 DIAGNOSIS — J029 Acute pharyngitis, unspecified: Secondary | ICD-10-CM | POA: Diagnosis not present

## 2020-02-25 MED ORDER — FAMOTIDINE 20 MG PO TABS: 20.0000 mg | ORAL_TABLET | Freq: Two times a day (BID) | ORAL | 1 refills | Status: AC

## 2020-02-25 MED ORDER — PREDNISONE 20 MG PO TABS
40.0000 mg | ORAL_TABLET | Freq: Once | ORAL | Status: AC
  Administered 2020-02-25: 40 mg via ORAL
  Filled 2020-02-25: qty 2

## 2020-02-25 MED ORDER — PREDNISONE 10 MG (21) PO TBPK
ORAL_TABLET | ORAL | 0 refills | Status: DC
Start: 2020-02-25 — End: 2022-08-10

## 2020-02-25 MED ORDER — MAGIC MOUTHWASH W/LIDOCAINE
5.0000 mL | Freq: Four times a day (QID) | ORAL | 0 refills | Status: DC | PRN
Start: 2020-02-25 — End: 2022-08-10

## 2020-02-25 NOTE — ED Notes (Signed)
Pt returns after leaving last night as she had to go to work. Pt states she has had sore throat x 3 weeks, worsening in last few days. States she has had some coughing as well. Pt alert & oriented, nad noted.

## 2020-02-25 NOTE — ED Triage Notes (Signed)
Pt states that she came to be seen last pm for her sore throat that has been bothering her for the past 2-3 weeks, states that she was swabbed last pm but couldn't wait due to her work schedule

## 2020-02-25 NOTE — ED Provider Notes (Signed)
[facility] Emergency Department Provider Note ____________________________________________  Time seen: 1633  I have reviewed the triage vital signs and the nursing notes.  HISTORY  Chief Complaint  Sore Throat  HPI Marie Sawyer is a 49 y.o. female returns to the ED for evaluation of ongoing sore throat pain.  Patient describes a 2 to 3-week complaint of intermittent sore throat pain.  She presented to the ED last night, had a throat swab, but left prior to being evaluated due to the protracted weight.  She denies any interim fever, chills, or sweats.    Past Medical History:  Diagnosis Date  . Anxiety   . Arthritis   . Back pain   . Depression   . Vertigo     Patient Active Problem List   Diagnosis Date Noted  . Clinical depression 01/29/2016  . Cephalalgia 01/29/2016  . Enlarged thyroid 01/29/2016  . Midline low back pain 01/29/2016    Past Surgical History:  Procedure Laterality Date  . CESAREAN SECTION    . SHOULDER SURGERY Right   . TUBAL LIGATION      Prior to Admission medications   Medication Sig Start Date End Date Taking? Authorizing Provider  albuterol (VENTOLIN HFA) 108 (90 Base) MCG/ACT inhaler Inhale 2 puffs into the lungs every 6 (six) hours as needed for wheezing or shortness of breath. 02/07/20   Enid Derry, PA-C  buPROPion (WELLBUTRIN XL) 300 MG 24 hr tablet Take 300 mg by mouth daily.    [provider]  clonazePAM (KLONOPIN) 1 MG tablet Take 1 mg by mouth 2 (two) times daily as needed for anxiety.    [provider]  famotidine (PEPCID) 20 MG tablet Take 1 tablet (20 mg total) by mouth 2 (two) times daily. 02/25/20 03/26/20  Andris Brothers, Charlesetta Ivory, PA-C  magic mouthwash w/lidocaine SOLN Take 5 mLs by mouth 4 (four) times daily as needed for mouth pain. 02/25/20   Dieter Hane, Charlesetta Ivory, PA-C  predniSONE (STERAPRED UNI-PAK 21 TAB) 10 MG (21) TBPK tablet 6-day taper as directed. 02/25/20   Oluwatimilehin Balfour, Charlesetta Ivory, PA-C  zolpidem (AMBIEN) 10 MG tablet Take 10 mg by mouth at bedtime as needed for sleep.    [provider]    Allergies Patient has no known allergies.  Family History  Problem Relation Age of Onset  . Diabetes Mother   . Liver disease Mother   . Kidney disease Mother   . Hypertension Father     Social History Social History   Tobacco Use  . Smoking status: Never Smoker  . Smokeless tobacco: Never Used  Substance Use Topics  . Alcohol use: Yes  . Drug use: No    Review of Systems  Constitutional: Negative for fever. Eyes: Negative for visual changes. ENT: Positive for left-sided sore throat.  Patient reports hoarseness Cardiovascular: Negative for chest pain. Respiratory: Negative for shortness of breath. Gastrointestinal: Negative for abdominal pain, vomiting and diarrhea.  Reports some reflux. Genitourinary: Negative for dysuria. Musculoskeletal: Negative for back pain. Skin: Negative for rash. Neurological: Negative for headaches, focal weakness or numbness. ____________________________________________  PHYSICAL EXAM:  VITAL SIGNS: ED Triage Vitals  Enc Vitals Group     BP 02/25/20 1616 (!) 127/92     Pulse Rate 02/25/20 1616 95     Resp 02/25/20 1616 16     Temp 02/25/20 1616 98.8 F (37.1 C)     Temp Source 02/25/20 1616 Oral     SpO2 02/25/20  1616 98 %     Weight 02/25/20 1617 165 lb (74.8 kg)     Height 02/25/20 1617 4\' 11"  (1.499 m)     Head Circumference --      Peak Flow --      Pain Score 02/25/20 1617 7     Pain Loc --      Pain Edu? --      Excl. in [place]? --     Constitutional: Alert and oriented. Well appearing and in no distress. Head: Normocephalic and atraumatic. Eyes: Conjunctivae are normal. PERRL. Normal extraocular movements Ears: Canals clear. TMs intact bilaterally. Nose: No congestion/rhinorrhea/epistaxis. Mouth/Throat: Mucous membranes are moist.  Uvula is midline and tonsils are flat.  No oropharyngeal lesions  are noted. Neck: Supple. No thyromegaly. Hematological/Lymphatic/Immunological: No cervical lymphadenopathy. Cardiovascular: Normal rate, regular rhythm. Normal distal pulses. Respiratory: Normal respiratory effort. No wheezes/rales/rhonchi. Gastrointestinal: Soft and nontender. No distention. Musculoskeletal: Nontender with normal range of motion in all extremities.  Neurologic:  Normal gait without ataxia. Normal speech and language. No gross focal neurologic deficits are appreciated. ____________________________________________   LABS (pertinent positives/negatives)  Strep PCR - Negative (02/24/20) ____________________________________________  PROCEDURES  Prednisone 40 mg p.o.  Procedures ____________________________________________  INITIAL IMPRESSION / ASSESSMENT AND PLAN / ED COURSE  Patient with ED evaluation of continued sore throat pain.  Patient presented last night but was not evaluated. Rapid strep test performed last night was reported as negative.  Patient with unilateral sore throat pain without signs of acute infectious etiology.  Symptoms may represent a viral etiology versus canker/aphthous ulcer versus reflux or heartburn.  Patient will be treated with prednisone and famotidine.  She is encouraged to follow-up with her primary provider as needed.  She should gargle with warm salt water, and use lozenges as needed.  Return precautions have been reviewed.  Marie Sawyer was evaluated in Emergency Department on 02/25/2020 for the symptoms described in the history of present illness. She was evaluated in the context of the global COVID-19 pandemic, which necessitated consideration that the patient might be at risk for infection with the SARS-CoV-2 virus that causes COVID-19. Institutional protocols and algorithms that pertain to the evaluation of patients at risk for COVID-19 are in a state of rapid change based on information released by regulatory bodies including the CDC  and federal and state organizations. These policies and algorithms were followed during the patient's care in the ED. ____________________________________________  FINAL CLINICAL IMPRESSION(S) / ED DIAGNOSES  Final diagnoses:  Sore throat      Carmie End, Dannielle Karvonen, PA-C 02/25/20 Kathaleen Maser    Lavonia Drafts, MD 02/25/20 [phone]

## 2020-02-25 NOTE — Discharge Instructions (Signed)
Take the prescription meds as directed. Follow-up with Dr. Clint Guy as needed.

## 2020-08-10 ENCOUNTER — Other Ambulatory Visit: Payer: Self-pay

## 2020-08-10 ENCOUNTER — Emergency Department
Admission: EM | Admit: 2020-08-10 | Discharge: 2020-08-10 | Disposition: A | Discharge status: Home / Self Care | Payer: BLUE CROSS/BLUE SHIELD | Attending: Emergency Medicine | Admitting: Emergency Medicine

## 2020-08-10 DIAGNOSIS — G43009 Migraine without aura, not intractable, without status migrainosus: Secondary | ICD-10-CM

## 2020-08-10 DIAGNOSIS — G43909 Migraine, unspecified, not intractable, without status migrainosus: Secondary | ICD-10-CM | POA: Insufficient documentation

## 2020-08-10 NOTE — ED Notes (Signed)
Pt states she has headache and is dizzy due to headache. 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

## 2020-08-10 NOTE — ED Provider Notes (Signed)
[facility] Emergency Department Provider Note  ____________________________________________  Time seen: Approximately 5:26 PM  I have reviewed the triage vital signs and the nursing notes.   HISTORY  Chief Complaint Migraine   HPI Marie Sawyer is a 49 y.o. female who presents to the emergency department requesting a work excuse. She had to leave work due to migraine. This is a typical migraine for her. She took Excedrin Migraine with significant improvement. She also had some dizziness, which is common for her, that has since resolved. Her job requires a note in order to return tomorrow.  Past Medical History:  Diagnosis Date  . Anxiety   . Arthritis   . Back pain   . Depression   . Vertigo     Patient Active Problem List   Diagnosis Date Noted  . Clinical depression 01/29/2016  . Cephalalgia 01/29/2016  . Enlarged thyroid 01/29/2016  . Midline low back pain 01/29/2016    Past Surgical History:  Procedure Laterality Date  . CESAREAN SECTION    . SHOULDER SURGERY Right   . TUBAL LIGATION      Prior to Admission medications   Medication Sig Start Date End Date Taking? Authorizing Provider  albuterol (VENTOLIN HFA) 108 (90 Base) MCG/ACT inhaler Inhale 2 puffs into the lungs every 6 (six) hours as needed for wheezing or shortness of breath. 02/07/20   Enid Derry, PA-C  buPROPion (WELLBUTRIN XL) 300 MG 24 hr tablet Take 300 mg by mouth daily.    [provider]  clonazePAM (KLONOPIN) 1 MG tablet Take 1 mg by mouth 2 (two) times daily as needed for anxiety.    [provider]  famotidine (PEPCID) 20 MG tablet Take 1 tablet (20 mg total) by mouth 2 (two) times daily. 02/25/20 03/26/20  Menshew, Charlesetta Ivory, PA-C  magic mouthwash w/lidocaine SOLN Take 5 mLs by mouth 4 (four) times daily as needed for mouth pain. 02/25/20   Menshew, Charlesetta Ivory, PA-C  predniSONE (STERAPRED UNI-PAK 21 TAB) 10 MG (21) TBPK tablet 6-day taper as  directed. 02/25/20   Menshew, Charlesetta Ivory, PA-C  zolpidem (AMBIEN) 10 MG tablet Take 10 mg by mouth at bedtime as needed for sleep.    [provider]    Allergies Patient has no known allergies.  Family History  Problem Relation Age of Onset  . Diabetes Mother   . Liver disease Mother   . Kidney disease Mother   . Hypertension Father     Social History Social History   Tobacco Use  . Smoking status: Never Smoker  . Smokeless tobacco: Never Used  Vaping Use  . Vaping Use: Never used  Substance Use Topics  . Alcohol use: Yes  . Drug use: No    Review of Systems Constitutional: Negative for fever. ENT: Negative for sore throat. Respiratory: Negative for cough Gastrointestinal: No abdominal pain.  No nausea, no vomiting.  No diarrhea.  Musculoskeletal: Negative for generalized body aches. Skin: Negative for rash/lesion/wound. Neurological: Positive for headaches, negative focal weakness or numbness.  ____________________________________________   PHYSICAL EXAM:  VITAL SIGNS: ED Triage Vitals [08/10/20 1629]  Enc Vitals Group     BP 135/85     Pulse Rate 88     Resp 18     Temp 97.6 F (36.4 C)     Temp Source Oral     SpO2 98 %     Weight      Height  Head Circumference      Peak Flow      Pain Score 3     Pain Loc      Pain Edu?      Excl. in [place]?     Constitutional: Alert and oriented. Well appearing and in no acute distress. Eyes: Conjunctivae are normal. PERRL. EOMI. Head: Atraumatic. Nose: No congestion/rhinnorhea. Mouth/Throat: Mucous membranes are moist. Neck: No stridor.  Cardiovascular: Normal rate, regular rhythm. Good peripheral circulation. Respiratory: Normal respiratory effort. Musculoskeletal: Full ROM throughout.  Neurologic:  Normal speech and language. No gross focal neurologic deficits are appreciated. Speech is normal. No gait instability. Skin:  Skin is warm, dry and intact. No rash noted. Psychiatric: Mood and  affect are normal. Speech and behavior are normal.  ____________________________________________   LABS (all labs ordered are listed, but only abnormal results are displayed)  Labs Reviewed - No data to display ____________________________________________  EKG  Not indicated. ____________________________________________  RADIOLOGY  Not indicated. ____________________________________________   PROCEDURES  None ____________________________________________   INITIAL IMPRESSION / ASSESSMENT AND PLAN / ED COURSE     Pertinent labs & imaging results that were available during my care of the patient were reviewed by me and considered in my medical decision making (see chart for details).  Patient was advised to follow up with primary care for symptoms that change or worsen.. ____________________________________________   FINAL CLINICAL IMPRESSION(S) / ED DIAGNOSES  Final diagnoses:  Migraine without aura and without status migrainosus, not intractable       Chinita Pester, FNP 08/10/20 1728    Merwyn Katos, MD 08/16/20 [phone]

## 2020-08-10 NOTE — ED Triage Notes (Signed)
PT to ED from work c/o migraine. PT was at work with her migraine and she started to feel dizzy, which is normal for her migraines. 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 PT states she is fine.

## 2021-01-29 ENCOUNTER — Emergency Department
Admission: EM | Admit: 2021-01-29 | Discharge: 2021-01-29 | Disposition: A | Discharge status: Home / Self Care | Payer: BLUE CROSS/BLUE SHIELD | Attending: Emergency Medicine | Admitting: Emergency Medicine

## 2021-01-29 ENCOUNTER — Other Ambulatory Visit: Payer: Self-pay

## 2021-01-29 DIAGNOSIS — R070 Pain in throat: Secondary | ICD-10-CM | POA: Diagnosis present

## 2021-01-29 DIAGNOSIS — J36 Peritonsillar abscess: Secondary | ICD-10-CM

## 2021-01-29 MED ORDER — AMOXICILLIN-POT CLAVULANATE 875-125 MG PO TABS
1.0000 | ORAL_TABLET | Freq: Two times a day (BID) | ORAL | 0 refills | Status: AC
Start: 2021-01-29 — End: 2021-02-05

## 2021-01-29 MED ORDER — BENZOCAINE (TOPICAL) 20 % EX AERO: 1.0000 "application " | INHALATION_SPRAY | Freq: Four times a day (QID) | CUTANEOUS | 0 refills | Status: DC | PRN

## 2021-01-29 MED ORDER — IBUPROFEN 800 MG PO TABS
800.0000 mg | ORAL_TABLET | Freq: Three times a day (TID) | ORAL | 0 refills | Status: DC | PRN
Start: 2021-01-29 — End: 2022-08-10

## 2021-01-29 NOTE — ED Triage Notes (Signed)
Pt to ED POV for sore throat today, thinks it is allergies. NAD noted, RR even and unlabored

## 2021-01-29 NOTE — ED Provider Notes (Signed)
[facility] Emergency Department Provider Note   ____________________________________________   Event Date/Time   First MD Initiated Contact with Patient 01/29/21 1456     (approximate)  I have reviewed the triage vital signs and the nursing notes.   HISTORY  Chief Complaint Sore Throat    HPI Marie Sawyer is a 50 y.o. female with below stated past medical history presents for right-sided throat pain that began approximately 24 hours prior to arrival and has been worsening since onset.  Patient describes aching, 10/10, nonradiating, right-sided throat pain as well as right submandibular pain that worsens with swallowing and has no relieving factors.  Patient also endorses subjective fevers.  Patient currently denies any vision changes, tinnitus, difficulty speaking, facial droop, chest pain, shortness of breath, abdominal pain, nausea/vomiting/diarrhea, dysuria, or weakness/numbness/paresthesias in any extremity         Past Medical History:  Diagnosis Date  . Anxiety   . Arthritis   . Back pain   . Depression   . Vertigo     Patient Active Problem List   Diagnosis Date Noted  . Clinical depression 01/29/2016  . Cephalalgia 01/29/2016  . Enlarged thyroid 01/29/2016  . Midline low back pain 01/29/2016    Past Surgical History:  Procedure Laterality Date  . CESAREAN SECTION    . SHOULDER SURGERY Right   . TUBAL LIGATION      Prior to Admission medications   Medication Sig Start Date End Date Taking? Authorizing Provider  amoxicillin-clavulanate (AUGMENTIN) 875-125 MG tablet Take 1 tablet by mouth 2 (two) times daily for 7 days. 01/29/21 02/05/21 Yes Azarie Coriz, Clent Jacks, MD  benzocaine (HURRICAINE) 20 % oral spray Use as directed 1 application in the mouth or throat 4 (four) times daily as needed for throat irritation / pain. 01/29/21  Yes Merwyn Katos, MD  ibuprofen (ADVIL) 800 MG tablet Take 1 tablet (800 mg total) by mouth every 8 (eight)  hours as needed. 01/29/21  Yes Merwyn Katos, MD  albuterol (VENTOLIN HFA) 108 (90 Base) MCG/ACT inhaler Inhale 2 puffs into the lungs every 6 (six) hours as needed for wheezing or shortness of breath. 02/07/20   Enid Derry, PA-C  buPROPion (WELLBUTRIN XL) 300 MG 24 hr tablet Take 300 mg by mouth daily.    [provider]  clonazePAM (KLONOPIN) 1 MG tablet Take 1 mg by mouth 2 (two) times daily as needed for anxiety.    [provider]  famotidine (PEPCID) 20 MG tablet Take 1 tablet (20 mg total) by mouth 2 (two) times daily. 02/25/20 03/26/20  Menshew, Charlesetta Ivory, PA-C  magic mouthwash w/lidocaine SOLN Take 5 mLs by mouth 4 (four) times daily as needed for mouth pain. 02/25/20   Menshew, Charlesetta Ivory, PA-C  predniSONE (STERAPRED UNI-PAK 21 TAB) 10 MG (21) TBPK tablet 6-day taper as directed. 02/25/20   Menshew, Charlesetta Ivory, PA-C  zolpidem (AMBIEN) 10 MG tablet Take 10 mg by mouth at bedtime as needed for sleep.    [provider]    Allergies Patient has no known allergies.  Family History  Problem Relation Age of Onset  . Diabetes Mother   . Liver disease Mother   . Kidney disease Mother   . Hypertension Father     Social History Social History   Tobacco Use  . Smoking status: Never Smoker  . Smokeless tobacco: Never Used  Vaping Use  . Vaping Use: Never used  Substance Use Topics  .  Alcohol use: Yes  . Drug use: No    Review of Systems Constitutional: Endorses fever/chills Eyes: No visual changes. ENT: Endorses right side sore throat. Cardiovascular: Denies chest pain. Respiratory: Denies shortness of breath. Gastrointestinal: No abdominal pain.  No nausea, no vomiting.  No diarrhea. Genitourinary: Negative for dysuria. Musculoskeletal: Negative for acute arthralgias Skin: Negative for rash. Neurological: Negative for headaches, weakness/numbness/paresthesias in any extremity Psychiatric: Negative for suicidal ideation/homicidal  ideation   ____________________________________________   PHYSICAL EXAM:  VITAL SIGNS: ED Triage Vitals  Enc Vitals Group     BP 01/29/21 1348 (!) 144/82     Pulse Rate 01/29/21 1348 88     Resp 01/29/21 1348 18     Temp 01/29/21 1348 98.7 F (37.1 C)     Temp Source 01/29/21 1348 Oral     SpO2 01/29/21 1348 99 %     Weight 01/29/21 1349 160 lb (72.6 kg)     Height 01/29/21 1349 4\' 11"  (1.499 m)     Head Circumference --      Peak Flow --      Pain Score 01/29/21 1349 10     Pain Loc --      Pain Edu? --      Excl. in [place]? --    Constitutional: Alert and oriented. Well appearing and in no acute distress. Eyes: Conjunctivae are normal. PERRL. Head: Atraumatic. Nose: No congestion/rhinnorhea. Mouth/Throat: Mucous membranes are moist.  Right peritonsillar erythema and swelling Neck: No stridor, right submandibular and anterior cervical lymphadenopathy Cardiovascular: Grossly normal heart sounds.  Good peripheral circulation. Respiratory: Normal respiratory effort.  No retractions. Gastrointestinal: Soft and nontender. No distention. Musculoskeletal: No obvious deformities Neurologic:  Normal speech and language. No gross focal neurologic deficits are appreciated. Skin:  Skin is warm and dry. No rash noted. Psychiatric: Mood and affect are normal. Speech and behavior are normal.  ____________________________________________   LABS (all labs ordered are listed, but only abnormal results are displayed)  Labs Reviewed - No data to display  PROCEDURES  Procedure(s) performed (including Critical Care):  Procedures   ____________________________________________   INITIAL IMPRESSION / ASSESSMENT AND PLAN / ED COURSE  As part of my medical decision making, I reviewed the following data within the electronic MEDICAL RECORD NUMBER Nursing notes reviewed and incorporated, Labs reviewed, EKG interpreted, Old chart reviewed, Radiograph reviewed and Notes from prior ED visits  reviewed and incorporated     Presentation most consistent with Right sided Peritonsillar cellulitis/abscess. Normal vitals, well appearing, normal neck ROM, tolerating secretions, and protecting airway. Given History and Exam I have a lower suspicion for RPA, Ludwigs, epiglottitis, acute HIV, Strep, or EBV.  Rx: Augmentin x7 days  Disposition: Discharge with instructions for prompt ENT and primary care follow-up.     ____________________________________________   FINAL CLINICAL IMPRESSION(S) / ED DIAGNOSES  Final diagnoses:  Peritonsillar cellulitis     ED Discharge Orders         Ordered    amoxicillin-clavulanate (AUGMENTIN) 875-125 MG tablet  2 times daily        01/29/21 1516    ibuprofen (ADVIL) 800 MG tablet  Every 8 hours PRN        01/29/21 1516    benzocaine (HURRICAINE) 20 % oral spray  4 times daily PRN        01/29/21 1516           Note:  This document was prepared using Dragon voice recognition software and may include unintentional dictation  errors.   Merwyn Katos, MD 01/29/21 [phone]

## 2022-05-20 ENCOUNTER — Other Ambulatory Visit: Payer: Self-pay

## 2022-05-20 ENCOUNTER — Emergency Department: Payer: BC Managed Care – PPO

## 2022-05-20 ENCOUNTER — Emergency Department
Admission: EM | Admit: 2022-05-20 | Discharge: 2022-05-20 | Disposition: A | Discharge status: Home / Self Care | Payer: BC Managed Care – PPO | Attending: Emergency Medicine | Admitting: Emergency Medicine

## 2022-05-20 DIAGNOSIS — M25512 Pain in left shoulder: Secondary | ICD-10-CM | POA: Diagnosis not present

## 2022-05-20 DIAGNOSIS — W11XXXA Fall on and from ladder, initial encounter: Secondary | ICD-10-CM | POA: Insufficient documentation

## 2022-05-20 DIAGNOSIS — S0083XA Contusion of other part of head, initial encounter: Secondary | ICD-10-CM

## 2022-05-20 DIAGNOSIS — W19XXXA Unspecified fall, initial encounter: Secondary | ICD-10-CM

## 2022-05-20 DIAGNOSIS — S0990XA Unspecified injury of head, initial encounter: Secondary | ICD-10-CM | POA: Diagnosis present

## 2022-05-20 MED ORDER — ACETAMINOPHEN 325 MG PO TABS
650.0000 mg | ORAL_TABLET | Freq: Once | ORAL | Status: AC
  Administered 2022-05-20: 650 mg via ORAL
  Filled 2022-05-20: qty 2

## 2022-05-20 NOTE — Discharge Instructions (Addendum)
Use Tylenol or Motrin for pain.  You can take 2 of the over-the-counter pills 4 times a day with the Tylenol.  You can use both together.  You can put ice on the shoulder 20 minutes every hour.  Keep it covered in a towel.  Do not fall asleep on ice because that can give you frostbite.  Tomorrow you can try heat this may help as well.  Do not fall asleep on heating pad either because you can get burns.  Please follow-up with Dr. Joice Lofts, the orthopedic doctor.  Give his office a call in the morning and let them know you are in in the ER and are having a lot of shoulder pain.  They should be out of follow-up with you in about a week or so once all the bruising and stiffness improves.  They will be out of make sure there is no tear in the rotator cuff or anything like that.  You can wear the sling during the day if it helps.  You do not have to wear the sling though.  Make sure you are moving your arm some.  You can swing it like a pendulum or try to walk it up the wall as I discussed with you here in the ER.  If something new turns up do not hesitate to return.

## 2022-05-20 NOTE — ED Notes (Signed)
Pt DC by EDP Malinda. Unable to obtain signature or vitals as pt was out of dept

## 2022-05-20 NOTE — ED Triage Notes (Signed)
PT coming POV from home for fall off a ladder- pt was on the middle rung when they fell, they hit their L arm and then the floor.   Pt stating they cannot lift their arm at the shoulder and has some bruising to bicep.  Pt also stating back pain.

## 2022-05-20 NOTE — ED Provider Triage Note (Signed)
Emergency Medicine Provider Triage Evaluation Note  Marie Sawyer, a 51 y.o. female  was evaluated in triage.  Pt complains of mechanical fall from a ladder.  Patient reports she was approximately 4 feet in the air when she fell off the ladder.  She complains of injury to her left upper arm when she hit the floor.  Patient presents with some bruising to the inner upper arm as well as to the left face and cheek.  She denies any LOC, nausea, vomiting, or dizziness but she reports pain with range of motion of the left shoulder.  Review of Systems  Positive: Facial contusion, RUE pain Negative: LOC  Physical Exam  BP (!) 137/98   Pulse 94   Temp 98.1 F (36.7 C)   Resp 12   Wt 73 kg   SpO2 100%   BMI 32.51 kg/m  Gen:   Awake, no distress  NAD Resp:  Normal effort CTA MSK:   Moves extremities without difficulty  CVS:  RRR  Medical Decision Making  Medically screening exam initiated at 7:46 PM.  Appropriate orders placed.  Marie Sawyer was informed that the remainder of the evaluation will be completed by another provider, this initial triage assessment does not replace that evaluation, and the importance of remaining in the ED until their evaluation is complete.  Patient to the ED for evaluation of injury sustained following a chemical fall from a ladder.  Patient presents with pain and bruising to the upper inner arm and the left side of the face and cheek.  She denies any loss of consciousness.   Lissa Hoard, PA-C 05/21/22 1946

## 2022-05-20 NOTE — ED Provider Notes (Signed)
[facility] Provider Note    Event Date/Time   First MD Initiated Contact with Patient 05/20/22 2047     (approximate)   History   Fall   HPI  Marie Sawyer is a 51 y.o. female who reports she fell off a ladder and hit her arm on the floor.  She says she cannot lift her arm but when I see her she is moving her arm at least parallel to the shoulder.  There is bruising around the middle upper arm.  Patient also complains of pain in the back just at the tip of her right scapula.      Physical Exam   Triage Vital Signs: ED Triage Vitals  Enc Vitals Group     BP 05/20/22 1748 (!) 137/98     Pulse Rate 05/20/22 1748 94     Resp 05/20/22 1748 12     Temp 05/20/22 1748 98.1 F (36.7 C)     Temp src --      SpO2 05/20/22 1748 100 %     Weight 05/20/22 1749 160 lb 15 oz (73 kg)     Height --      Head Circumference --      Peak Flow --      Pain Score 05/20/22 1749 8     Pain Loc --      Pain Edu? --      Excl. in GC? --     Most recent vital signs: Vitals:   05/20/22 1748  BP: (!) 137/98  Pulse: 94  Resp: 12  Temp: 98.1 F (36.7 C)  SpO2: 100%     General: Awake, no distress.  Head.  There is some bruising on the left cheek.  Some swelling also. Neck is now supple and nontender CV:  Good peripheral perfusion.  Heart regular rate and rhythm no audible murmurs Resp:  Normal effort.  Lungs clear Chest: Tender as noted in HPI Abd:  No distention.  Soft and nontender    ED Results / Procedures / Treatments   Labs (all labs ordered are listed, but only abnormal results are displayed) Labs Reviewed - No data to display   EKG     RADIOLOGY CT head read by radiology reviewed and interpreted by me is negative. CT maxillofacial read by radiology reviewed and interpreted by me as negative. Shoulder x-ray read by radiology reviewed and interpreted by me as negative Chest x-ray reviewed and interpreted by me is negative radiology  looks at it and confirms this. PROCEDURES:  Critical Care performed:   Procedures   MEDICATIONS ORDERED IN ED: Medications  acetaminophen (TYLENOL) tablet 650 mg (650 mg Oral Given 05/20/22 2135)     IMPRESSION / MDM / ASSESSMENT AND PLAN / ED COURSE  I reviewed the triage vital signs and the nursing notes. ----------------------------------------- 10:12 PM on 05/20/2022 ----------------------------------------- Studies are all normal.  Patient is awake alert looks bruised but good.  I will have her follow-up with Dr. Reginia Naas who is on-call for orthopedics to evaluate this shoulder once the acute pain is improved.  Differential diagnosis includes, but is not limited to, contusions possible bone contusion in the shoulder or rotator cuff tear   Patient's presentation is most consistent with acute complicated illness / injury requiring diagnostic workup.       FINAL CLINICAL IMPRESSION(S) / ED DIAGNOSES   Final diagnoses:  Fall, initial encounter  Contusion of face, initial encounter  Acute pain of left  shoulder     Rx / DC Orders   ED Discharge Orders     None        Note:  This document was prepared using Dragon voice recognition software and may include unintentional dictation errors.   Arnaldo Natal, MD 05/20/22 [phone]

## 2022-08-10 ENCOUNTER — Emergency Department
Admission: EM | Admit: 2022-08-10 | Discharge: 2022-08-10 | Disposition: A | Discharge status: Home / Self Care | Payer: BC Managed Care – PPO | Attending: Emergency Medicine | Admitting: Emergency Medicine

## 2022-08-10 ENCOUNTER — Emergency Department: Payer: BC Managed Care – PPO

## 2022-08-10 ENCOUNTER — Other Ambulatory Visit: Payer: Self-pay

## 2022-08-10 DIAGNOSIS — J209 Acute bronchitis, unspecified: Secondary | ICD-10-CM | POA: Diagnosis not present

## 2022-08-10 DIAGNOSIS — R059 Cough, unspecified: Secondary | ICD-10-CM | POA: Diagnosis present

## 2022-08-10 MED ORDER — AMOXICILLIN 875 MG PO TABS: 875.0000 mg | ORAL_TABLET | Freq: Two times a day (BID) | ORAL | 0 refills | Status: DC

## 2022-08-10 MED ORDER — ALBUTEROL SULFATE HFA 108 (90 BASE) MCG/ACT IN AERS: 2.0000 | INHALATION_SPRAY | Freq: Four times a day (QID) | RESPIRATORY_TRACT | 0 refills | Status: DC | PRN

## 2022-08-10 MED ORDER — DEXAMETHASONE 0.5 MG PO TABS: ORAL_TABLET | ORAL | 0 refills | Status: DC

## 2022-08-10 MED ORDER — IPRATROPIUM-ALBUTEROL 0.5-2.5 (3) MG/3ML IN SOLN
3.0000 mL | Freq: Once | RESPIRATORY_TRACT | Status: AC
  Administered 2022-08-10: 3 mL via RESPIRATORY_TRACT
  Filled 2022-08-10: qty 3

## 2022-08-10 MED ORDER — PSEUDOEPH-BROMPHEN-DM 30-2-10 MG/5ML PO SYRP: 5.0000 mL | ORAL_SOLUTION | Freq: Four times a day (QID) | ORAL | 0 refills | Status: DC | PRN

## 2022-08-10 NOTE — Discharge Instructions (Signed)
Follow up with your regular doctor if not improving in 3 days, return if worsening 

## 2022-08-10 NOTE — ED Triage Notes (Signed)
Pt reports a cough for the past 1.5 weeks that is sometimes productive.

## 2022-08-10 NOTE — ED Provider Notes (Signed)
[facility] Provider Note    None    (approximate)   History   Cough   HPI  Marie Sawyer is a 51 y.o. female with no significant past medical history presents emergency department complaining of a cough for the past 1 and half weeks.  Sometimes it is productive.  Sometimes it is hacking and dry and she cannot catch her breath.  Patient states that she originally had fever and chills and was sick.  She did not get a flu or COVID test and does not want to be tested at this time.  She denies chest pain or shortness of breath.  Tried multiple over-the-counter medications without any relief.      Physical Exam   Triage Vital Signs: ED Triage Vitals  Enc Vitals Group     BP 08/10/22 1448 (!) 149/112     Pulse Rate 08/10/22 1448 90     Resp 08/10/22 1448 18     Temp 08/10/22 1448 97.8 F (36.6 C)     Temp Source 08/10/22 1448 Oral     SpO2 08/10/22 1448 97 %     Weight 08/10/22 1411 160 lb 15 oz (73 kg)     Height 08/10/22 1411 4\' 11"  (1.499 m)     Head Circumference --      Peak Flow --      Pain Score 08/10/22 1411 3     Pain Loc --      Pain Edu? --      Excl. in GC? --     Most recent vital signs: Vitals:   08/10/22 1448  BP: (!) 149/112  Pulse: 90  Resp: 18  Temp: 97.8 F (36.6 C)  SpO2: 97%     General: Awake, no distress.   CV:  Good peripheral perfusion. regular rate and  rhythm Resp:  Normal effort. Lungs CTA, patient has bronchospasms with dry hacking coughing Abd:  No distention.   Other:      ED Results / Procedures / Treatments   Labs (all labs ordered are listed, but only abnormal results are displayed) Labs Reviewed - No data to display   EKG     RADIOLOGY Chest x-ray    PROCEDURES:   Procedures   MEDICATIONS ORDERED IN ED: Medications  ipratropium-albuterol (DUONEB) 0.5-2.5 (3) MG/3ML nebulizer solution 3 mL (3 mLs Nebulization Given 08/10/22 1502)     IMPRESSION / MDM / ASSESSMENT AND PLAN  / ED COURSE  I reviewed the triage vital signs and the nursing notes.                              Differential diagnosis includes, but is not limited to, acute bronchitis, CAP, COVID, influenza  Patient's presentation is most consistent with acute complicated illness / injury requiring diagnostic workup.   At this time since she has been sick for 1.5 weeks I do not feel that she has to have a COVID or influenza test.  Did discuss this with her.  She had refused a test earlier.  She is still refusing.  Patient was given a DuoNeb to see if this would help with the bronchospasm.  Following the DuoNeb's she has decreased cough.  Lungs are clear to auscultation.  Chest x-ray independently reviewed and interpreted by me as being negative for any acute abnormality  Did explain these findings to the patient.  She was given a prescription for  Decadron, Bromfed, amoxicillin, and albuterol inhaler.  She is to follow-up with her regular doctor if not improving in 3 days.  Return emergency department worsening.  She is in agreement treatment plan.  She is stable and do not feel that she needs a mission.  Discharged in stable condition.      FINAL CLINICAL IMPRESSION(S) / ED DIAGNOSES   Final diagnoses:  Acute bronchitis, unspecified organism     Rx / DC Orders   ED Discharge Orders          Ordered    amoxicillin (AMOXIL) 875 MG tablet  2 times daily        08/10/22 1502    dexamethasone (DECADRON) 0.5 MG tablet        08/10/22 1502    brompheniramine-pseudoephedrine-DM 30-2-10 MG/5ML syrup  4 times daily PRN        08/10/22 1502    albuterol (VENTOLIN HFA) 108 (90 Base) MCG/ACT inhaler  Every 6 hours PRN,   Status:  Discontinued        08/10/22 1502    albuterol (VENTOLIN HFA) 108 (90 Base) MCG/ACT inhaler  Every 6 hours PRN        08/10/22 1509             Note:  This document was prepared using Dragon voice recognition software and may include unintentional dictation  errors.    Faythe Ghee, PA-C 08/10/22 1701    Sharyn Creamer, MD 08/10/22 [phone]

## 2022-10-18 ENCOUNTER — Other Ambulatory Visit: Payer: Self-pay

## 2022-10-18 ENCOUNTER — Emergency Department
Admission: EM | Admit: 2022-10-18 | Discharge: 2022-10-18 | Disposition: A | Discharge status: Home / Self Care | Payer: BC Managed Care – PPO | Attending: Emergency Medicine | Admitting: Emergency Medicine

## 2022-10-18 DIAGNOSIS — N760 Acute vaginitis: Secondary | ICD-10-CM | POA: Insufficient documentation

## 2022-10-18 DIAGNOSIS — B3731 Acute candidiasis of vulva and vagina: Secondary | ICD-10-CM | POA: Diagnosis not present

## 2022-10-18 DIAGNOSIS — E119 Type 2 diabetes mellitus without complications: Secondary | ICD-10-CM | POA: Insufficient documentation

## 2022-10-18 DIAGNOSIS — A6000 Herpesviral infection of urogenital system, unspecified: Secondary | ICD-10-CM | POA: Diagnosis not present

## 2022-10-18 DIAGNOSIS — B9689 Other specified bacterial agents as the cause of diseases classified elsewhere: Secondary | ICD-10-CM

## 2022-10-18 DIAGNOSIS — R350 Frequency of micturition: Secondary | ICD-10-CM | POA: Diagnosis present

## 2022-10-18 LAB — WET PREP, GENITAL
Sperm: NONE SEEN
Trich, Wet Prep: NONE SEEN
WBC, Wet Prep HPF POC: <10 (ref <10)

## 2022-10-18 LAB — URINALYSIS, ROUTINE W REFLEX MICROSCOPIC
Bacteria, UA: NONE SEEN
Bilirubin Urine: NEGATIVE
Glucose, UA: >=500 mg/dL — AB
Hgb urine dipstick: NEGATIVE
Ketones, ur: NEGATIVE mg/dL
Nitrite: NEGATIVE
Protein, ur: NEGATIVE mg/dL
Specific Gravity, Urine: 1.027 (ref 1.005–1.030)
pH: 5 (ref 5.0–8.0)

## 2022-10-18 LAB — CBG MONITORING, ED: Glucose-Capillary: 201 mg/dL — ABNORMAL HIGH (ref 70–99)

## 2022-10-18 LAB — CHLAMYDIA/NGC RT PCR (ARMC ONLY)
Chlamydia Tr: NOT DETECTED
N gonorrhoeae: NOT DETECTED

## 2022-10-18 LAB — POC URINE PREG, ED: Preg Test, Ur: NEGATIVE

## 2022-10-18 MED ORDER — METRONIDAZOLE 500 MG PO TABS: 500.0000 mg | ORAL_TABLET | Freq: Two times a day (BID) | ORAL | 0 refills | Status: AC

## 2022-10-18 MED ORDER — FLUCONAZOLE 150 MG PO TABS: ORAL_TABLET | ORAL | 1 refills | Status: DC

## 2022-10-18 MED ORDER — VALACYCLOVIR HCL 1 G PO TABS: 1000.0000 mg | ORAL_TABLET | Freq: Two times a day (BID) | ORAL | 0 refills | Status: AC

## 2022-10-18 NOTE — Discharge Instructions (Signed)
Take Diflucan once. Take Flagyl twice daily for the next 7 days. Take Valtrex twice daily for the next 7 days.

## 2022-10-18 NOTE — ED Triage Notes (Signed)
Pt to ED from home. Pt is having burning during urination x 2-3 months. Pt advised it has gotten worse so she came here. Pt has not been seen for same. Pt thinks she could also have an STD as as her husband was cheating on her. Pt denies any discharge or foul smell coming from her vagina. Pt is CAOx4 and in no acute distress and ambulatory in triage.

## 2022-10-18 NOTE — ED Provider Notes (Signed)
[facility] Provider Note  Patient Contact: 4:50 PM (approximate)   History   Urinary Frequency (UTI)   HPI  Marie Sawyer is a 52 y.o. female with a history of diabetes, presents to the emergency department with a burning vaginal rash that became apparent 2 days ago.  Patient states that her current symptoms do not feel like a urinary tract infection.  She reports that her husband was unfaithful several months ago and underwent STD testing which seem negative at that time.  She denies changes in vaginal discharge but would like to be tested for gonorrhea, chlamydia and trichomoniasis.  She denies deep dyspareunia.  No fever or chills at home.  Patient would also like her blood sugar tested.      Physical Exam   Triage Vital Signs: ED Triage Vitals  Enc Vitals Group     BP 10/18/22 1545 133/86     Pulse Rate 10/18/22 1545 80     Resp 10/18/22 1545 17     Temp 10/18/22 1545 98.1 F (36.7 C)     Temp Source 10/18/22 1545 Oral     SpO2 10/18/22 1545 98 %     Weight 10/18/22 1548 165 lb (74.8 kg)     Height 10/18/22 1548 4\' 11"  (1.499 m)     Head Circumference --      Peak Flow --      Pain Score 10/18/22 1552 4     Pain Loc --      Pain Edu? --      Excl. in [place]? --     Most recent vital signs: Vitals:   10/18/22 1545  BP: 133/86  Pulse: 80  Resp: 17  Temp: 98.1 F (36.7 C)  SpO2: 98%     General: Alert and in no acute distress. Eyes:  PERRL. EOMI. Head: No acute traumatic findings ENT:      Nose: No congestion/rhinnorhea.      Mouth/Throat: Mucous membranes are moist. Neck: No stridor. No cervical spine tenderness to palpation. Cardiovascular:  Good peripheral perfusion Respiratory: Normal respiratory effort without tachypnea or retractions. Lungs CTAB. Good air entry to the bases with no decreased or absent breath sounds. Gastrointestinal: Bowel sounds 4 quadrants. Soft and nontender to palpation. No guarding or rigidity. No  palpable masses. No distention. No CVA tenderness. Musculoskeletal: Full range of motion to all extremities.  Neurologic:  No gross focal neurologic deficits are appreciated.  Skin: Patient has burning, erythematous, vesicular rash along mons pubis. Other:   ED Results / Procedures / Treatments   Labs (all labs ordered are listed, but only abnormal results are displayed) Labs Reviewed  WET PREP, GENITAL - Abnormal; Notable for the following components:      Result Value   Yeast Wet Prep HPF POC PRESENT (*)    Clue Cells Wet Prep HPF POC PRESENT (*)    All other components within normal limits  URINALYSIS, ROUTINE W REFLEX MICROSCOPIC - Abnormal; Notable for the following components:   Color, Urine YELLOW (*)    APPearance HAZY (*)    Glucose, UA >=500 (*)    Leukocytes,Ua MODERATE (*)    All other components within normal limits  CBG MONITORING, ED - Abnormal; Notable for the following components:   Glucose-Capillary 201 (*)    All other components within normal limits  CHLAMYDIA/NGC RT PCR ([facility] ONLY)            POC URINE PREG, ED  PROCEDURES:  Critical Care performed: No  Procedures   MEDICATIONS ORDERED IN ED: Medications - No data to display   IMPRESSION / MDM / [place] / ED COURSE  I reviewed the triage vital signs and the nursing notes.                              Assessment and plan:  Genital herpes:   Dysuria Vaginal itching 52 year old female presents to the emergency department with vaginal itching and burning, erythematous rash to the vagina.  Vital signs were reassuring at triage.  On exam, patient alert and nontoxic-appearing.  Physical exam concerning for genital herpes.  Wet prep was positive for yeast and clue cells.  Patient was started on Valtrex, Diflucan and Flagyl.  Patient was cautioned that if gonorrhea and Chlamydia testing is positive, she will have to return to the emergency department or health department for  treatment.   FINAL CLINICAL IMPRESSION(S) / ED DIAGNOSES   Final diagnoses:  Genital herpes simplex, unspecified site  Yeast vaginitis  Bacterial vaginosis     Rx / DC Orders   ED Discharge Orders          Ordered    fluconazole (DIFLUCAN) 150 MG tablet        10/18/22 1730    metroNIDAZOLE (FLAGYL) 500 MG tablet  2 times daily        10/18/22 1730    valACYclovir (VALTREX) 1000 MG tablet  2 times daily        10/18/22 1730             Note:  This document was prepared using Dragon voice recognition software and may include unintentional dictation errors.   Vallarie Mare [place], PA-C 10/18/22 Evalee Jefferson    Lavonia Drafts, MD 10/18/22 [phone]

## 2023-01-30 ENCOUNTER — Other Ambulatory Visit: Payer: Self-pay

## 2023-01-30 DIAGNOSIS — Z7984 Long term (current) use of oral hypoglycemic drugs: Secondary | ICD-10-CM | POA: Diagnosis not present

## 2023-01-30 DIAGNOSIS — E1165 Type 2 diabetes mellitus with hyperglycemia: Secondary | ICD-10-CM | POA: Diagnosis not present

## 2023-01-30 DIAGNOSIS — M79671 Pain in right foot: Secondary | ICD-10-CM | POA: Diagnosis present

## 2023-01-30 DIAGNOSIS — M79672 Pain in left foot: Secondary | ICD-10-CM | POA: Diagnosis not present

## 2023-01-30 LAB — URINALYSIS, ROUTINE W REFLEX MICROSCOPIC
Bilirubin Urine: NEGATIVE
Glucose, UA: >=500 mg/dL — AB
Hgb urine dipstick: NEGATIVE
Ketones, ur: NEGATIVE mg/dL
Nitrite: NEGATIVE
Protein, ur: NEGATIVE mg/dL
Specific Gravity, Urine: 1.02 (ref 1.005–1.030)
pH: 5 (ref 5.0–8.0)

## 2023-01-30 LAB — BASIC METABOLIC PANEL
Anion gap: 9 (ref 5–15)
BUN: 11 mg/dL (ref 6–20)
CO2: 25 mmol/L (ref 22–32)
Calcium: 9.1 mg/dL (ref 8.9–10.3)
Chloride: 103 mmol/L (ref 98–111)
Creatinine, Ser: 0.56 mg/dL (ref 0.44–1.00)
GFR, Estimated: >60 mL/min (ref >60)
Glucose, Bld: 213 mg/dL — ABNORMAL HIGH (ref 70–99)
Potassium: 3.8 mmol/L (ref 3.5–5.1)
Sodium: 137 mmol/L (ref 135–145)

## 2023-01-30 LAB — CBG MONITORING, ED: Glucose-Capillary: 209 mg/dL — ABNORMAL HIGH (ref 70–99)

## 2023-01-30 LAB — POC URINE PREG, ED: Preg Test, Ur: NEGATIVE

## 2023-01-30 LAB — CBC
HCT: 42.7 % (ref 36.0–46.0)
Hemoglobin: 14.5 g/dL (ref 12.0–15.0)
MCH: 28.9 pg (ref 26.0–34.0)
MCHC: 34 g/dL (ref 30.0–36.0)
MCV: 85.1 fL (ref 80.0–100.0)
Platelets: 308 10*3/uL (ref 150–400)
RBC: 5.02 MIL/uL (ref 3.87–5.11)
RDW: 12.7 % (ref 11.5–15.5)
WBC: 9.8 10*3/uL (ref 4.0–10.5)
nRBC: 0 % (ref 0.0–0.2)

## 2023-01-30 NOTE — ED Triage Notes (Signed)
Pt to ED via POV c/o high blood sugar and nerve damage.  Pt states she's been having trouble walking for a few months now from nerve damage. 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 Pt also endorses irritation with urination.

## 2023-01-31 ENCOUNTER — Emergency Department
Admission: EM | Admit: 2023-01-31 | Discharge: 2023-01-31 | Disposition: A | Discharge status: Home / Self Care | Payer: BC Managed Care – PPO | Attending: Emergency Medicine | Admitting: Emergency Medicine

## 2023-01-31 DIAGNOSIS — M79671 Pain in right foot: Secondary | ICD-10-CM

## 2023-01-31 DIAGNOSIS — R739 Hyperglycemia, unspecified: Secondary | ICD-10-CM

## 2023-01-31 LAB — CBG MONITORING, ED: Glucose-Capillary: 131 mg/dL — ABNORMAL HIGH (ref 70–99)

## 2023-01-31 MED ORDER — METFORMIN HCL 500 MG PO TABS: 500.0000 mg | ORAL_TABLET | Freq: Two times a day (BID) | ORAL | 0 refills | Status: DC

## 2023-01-31 MED ORDER — ACETAMINOPHEN 500 MG PO TABS
1000.0000 mg | ORAL_TABLET | Freq: Once | ORAL | Status: AC
  Administered 2023-01-31: 1000 mg via ORAL
  Filled 2023-01-31: qty 2

## 2023-01-31 MED ORDER — IBUPROFEN 600 MG PO TABS
600.0000 mg | ORAL_TABLET | Freq: Once | ORAL | Status: AC
  Administered 2023-01-31: 600 mg via ORAL
  Filled 2023-01-31: qty 1

## 2023-01-31 NOTE — ED Provider Notes (Signed)
[facility] Provider Note    Event Date/Time   First MD Initiated Contact with Patient 01/31/23 0018     (approximate)   History   Hyperglycemia   HPI  Marie Sawyer is a 52 y.o. female past medical history significant for diabetes who presents to the emergency department for elevated glucose and pain in her feet.  Patient [name] is ongoing pain to both of her feet for the past couple of months.  States that it is worse whenever she is working and that it immediately hurts after she rest for second.  Constant pain throughout the day.  Pain is worse to the bottom of both of her feet.  Was concerned that she might have neuropathy from poorly controlled diabetes.  Patient was on metformin but states that she stopped it because she believed it was giving her vertigo.  Has not followed up with a primary care physician in the past couple of years since no longer took her insurance.  Denies any abdominal pain nausea or vomiting.  Denies any falls or trauma.  No headache.  Took ibuprofen and Tylenol without significant improvement.     Physical Exam   Triage Vital Signs: ED Triage Vitals  Enc Vitals Group     BP 01/30/23 1925 139/85     Pulse Rate 01/30/23 1925 88     Resp 01/30/23 1925 20     Temp 01/30/23 1925 98 F (36.7 C)     Temp Source 01/30/23 1925 Oral     SpO2 01/30/23 1925 98 %     Weight 01/30/23 1928 165 lb (74.8 kg)     Height 01/30/23 1928 4\' 11"  (1.499 m)     Head Circumference --      Peak Flow --      Pain Score 01/30/23 1927 10     Pain Loc --      Pain Edu? --      Excl. in GC? --     Most recent vital signs: Vitals:   01/30/23 1925 01/31/23 0015  BP: 139/85 (!) 154/94  Pulse: 88 77  Resp: 20 18  Temp: 98 F (36.7 C)   SpO2: 98% 100%    Physical Exam Constitutional:      Appearance: She is well-developed.  HENT:     Head: Atraumatic.  Eyes:     Conjunctiva/sclera: Conjunctivae normal.  Cardiovascular:     Rate and  Rhythm: Regular rhythm.  Pulmonary:     Effort: No respiratory distress.  Abdominal:     General: There is no distension.  Musculoskeletal:        General: Normal range of motion.     Cervical back: Normal range of motion.     Right lower leg: No edema.     Left lower leg: No edema.     Comments: +2 DP pulses that are equal bilaterally  Skin:    General: Skin is warm.     Capillary Refill: Capillary refill takes less than 2 seconds.  Neurological:     Mental Status: She is alert. Mental status is at baseline.      IMPRESSION / MDM / ASSESSMENT AND PLAN / ED COURSE  I reviewed the triage vital signs and the nursing notes.  Differential diagnosis including hyperglycemia, DKA, electrolyte abnormality, dehydration, plantar fasciitis, diabetic neuropathy.  Denies any alcohol use.  Nonfocal neurologic exam.  +2 DP pulses that are equal bilaterally, no concern for PAD.  Labs (all labs ordered are listed, but only abnormal results are displayed) Labs interpreted as -    Labs Reviewed  BASIC METABOLIC PANEL - Abnormal; Notable for the following components:      Result Value   Glucose, Bld 213 (*)    All other components within normal limits  URINALYSIS, ROUTINE W REFLEX MICROSCOPIC - Abnormal; Notable for the following components:   Color, Urine YELLOW (*)    APPearance HAZY (*)    Glucose, UA >=500 (*)    Leukocytes,Ua TRACE (*)    Bacteria, UA RARE (*)    All other components within normal limits  CBG MONITORING, ED - Abnormal; Notable for the following components:   Glucose-Capillary 209 (*)    All other components within normal limits  CBG MONITORING, ED - Abnormal; Notable for the following components:   Glucose-Capillary 131 (*)    All other components within normal limits  CBC  CBG MONITORING, ED  POC URINE PREG, ED    Hyperglycemia but does not meet criteria for DKA.  No findings consistent with urinary tract infection.  Significant electrolyte abnormalities.   Will restart the patient on metformin for her diabetes.  Given a referral to primary care provider and given information to establish care with a primary care provider.  Given information to follow-up with podiatry.  Discussed possible plantar fasciitis versus peripheral neuropathy.  Given return precautions for any worsening symptoms.  Given Tylenol and Motrin in the emergency department.     PROCEDURES:  Critical Care performed: No  Procedures  Patient's presentation is most consistent with acute presentation with potential threat to life or bodily function.   MEDICATIONS ORDERED IN ED: Medications  acetaminophen (TYLENOL) tablet 1,000 mg (has no administration in time range)  ibuprofen (ADVIL) tablet 600 mg (has no administration in time range)    FINAL CLINICAL IMPRESSION(S) / ED DIAGNOSES   Final diagnoses:  Hyperglycemia  Pain in both feet     Rx / DC Orders   ED Discharge Orders          Ordered    Ambulatory Referral to Primary Care (Establish Care)        01/31/23 0117    metFORMIN (GLUCOPHAGE) 500 MG tablet  2 times daily with meals        01/31/23 0121             Note:  This document was prepared using Dragon voice recognition software and may include unintentional dictation errors.   Corena Herter, MD 01/31/23 [phone]

## 2023-01-31 NOTE — ED Notes (Signed)
PT brought to ed rm 5H at this time, this RN now assuming care.  

## 2023-01-31 NOTE — ED Notes (Signed)
ED Provider at bedside. 

## 2023-01-31 NOTE — Discharge Instructions (Addendum)
You are seen in the emergency department for foot pain and elevated blood sugar.  Your glucose came down on its own.  It is importantly follow-up with a primary care provider.  You are restarted on metformin -take as prescribed and they can follow-up with her primary care provider.  Watch the glucose that you are eating and count your carbs.  You are given information to follow-up with the podiatrist.  Pain control:  Ibuprofen (motrin/aleve/advil) - You can take 3-4 tablets (600-800 mg) every 6 hours as needed for pain/fever.  Acetaminophen (tylenol) - You can take 2 extra strength tablets (1000 mg) every 6 hours as needed for pain/fever.  You can alternate these medications or take them together.  Make sure you eat food/drink water when taking these medications.

## 2023-10-22 ENCOUNTER — Other Ambulatory Visit: Payer: Self-pay

## 2023-10-22 ENCOUNTER — Emergency Department
Admission: EM | Admit: 2023-10-22 | Discharge: 2023-10-22 | Disposition: A | Discharge status: Home / Self Care | Payer: 59 | Attending: Emergency Medicine | Admitting: Emergency Medicine

## 2023-10-22 ENCOUNTER — Encounter: Payer: Self-pay | Admitting: Emergency Medicine

## 2023-10-22 DIAGNOSIS — N3090 Cystitis, unspecified without hematuria: Secondary | ICD-10-CM | POA: Diagnosis not present

## 2023-10-22 DIAGNOSIS — L301 Dyshidrosis [pompholyx]: Secondary | ICD-10-CM | POA: Insufficient documentation

## 2023-10-22 DIAGNOSIS — R300 Dysuria: Secondary | ICD-10-CM | POA: Diagnosis present

## 2023-10-22 LAB — URINALYSIS, ROUTINE W REFLEX MICROSCOPIC
Bilirubin Urine: NEGATIVE
Glucose, UA: 50 mg/dL — AB
Ketones, ur: NEGATIVE mg/dL
Nitrite: NEGATIVE
Protein, ur: 30 mg/dL — AB
RBC / HPF: >50 RBC/hpf (ref 0–5)
Specific Gravity, Urine: 1.017 (ref 1.005–1.030)
WBC, UA: >50 WBC/hpf (ref 0–5)
pH: 5 (ref 5.0–8.0)

## 2023-10-22 LAB — PREGNANCY, URINE: Preg Test, Ur: NEGATIVE

## 2023-10-22 LAB — POC URINE PREG, ED: Preg Test, Ur: NEGATIVE

## 2023-10-22 MED ORDER — CEPHALEXIN 500 MG PO CAPS: 500.0000 mg | ORAL_CAPSULE | Freq: Two times a day (BID) | ORAL | 0 refills | Status: DC

## 2023-10-22 MED ORDER — CEPHALEXIN 500 MG PO CAPS: 500.0000 mg | ORAL_CAPSULE | Freq: Two times a day (BID) | ORAL | 0 refills | Status: AC

## 2023-10-22 NOTE — ED Provider Notes (Signed)
[facility] Provider Note    Event Date/Time   First MD Initiated Contact with Patient 10/22/23 0830     (approximate)   History   Urinary Frequency   HPI  Marie Sawyer is a 53 y.o. female who comes ED complaining of pelvic pressure and dysuria that started this morning, feels like her previous UTI that she had.  No fever or flank pain.  No chest pain or shortness of breath.  No abnormal vaginal bleeding or discharge.  She does report recently completing a 7-day course of Monistat due to suspected yeast infection and irritation.  She also complains of a rash on bilateral forearms and exposed area of upper chest above her T-shirt collar line.  Feels somewhat itchy, not painful.  Denies any body aches or recent illness     Physical Exam   Triage Vital Signs: ED Triage Vitals  Encounter Vitals Group     BP 10/22/23 0808 (!) 142/88     Systolic BP Percentile --      Diastolic BP Percentile --      Pulse Rate 10/22/23 0808 84     Resp 10/22/23 0808 18     Temp 10/22/23 0808 97.8 F (36.6 C)     Temp Source 10/22/23 0808 Oral     SpO2 10/22/23 0808 96 %     Weight 10/22/23 0809 165 lb (74.8 kg)     Height 10/22/23 0809 4\' 11"  (1.499 m)     Head Circumference --      Peak Flow --      Pain Score --      Pain Loc --      Pain Education --      Exclude from Growth Chart --     Most recent vital signs: Vitals:   10/22/23 0808  BP: (!) 142/88  Pulse: 84  Resp: 18  Temp: 97.8 F (36.6 C)  SpO2: 96%    General: Awake, no distress.  CV:  Good peripheral perfusion.  Resp:  Normal effort.  Abd:  No distention.  Soft with suprapubic tenderness Other:  Fine papular rash with dry skin on bilateral forearms and upper central chest.  No confluent erythematous patches, no vesicles, no petechia purpura or bullae.  Not warm or tender.  No swelling.   ED Results / Procedures / Treatments   Labs (all labs ordered are listed, but only abnormal  results are displayed) Labs Reviewed  URINALYSIS, ROUTINE W REFLEX MICROSCOPIC - Abnormal; Notable for the following components:      Result Value   Color, Urine YELLOW (*)    APPearance CLOUDY (*)    Glucose, UA 50 (*)    Hgb urine dipstick LARGE (*)    Protein, ur 30 (*)    Leukocytes,Ua SMALL (*)    Bacteria, UA RARE (*)    All other components within normal limits  PREGNANCY, URINE  POC URINE PREG, ED     RADIOLOGY    PROCEDURES:  Procedures   MEDICATIONS ORDERED IN ED: Medications - No data to display   IMPRESSION / MDM / ASSESSMENT AND PLAN / ED COURSE  I reviewed the triage vital signs and the nursing notes.                              Differential diagnosis includes, but is not limited to, dyshidrotic eczema, UTI, pregnancy, cutaneous candidiasis  Patient presents with  minor complaints, overall nontoxic, vital signs and exam reassuring.  Will check urinalysis and pregnancy test.  Can follow-up with primary care.     FINAL CLINICAL IMPRESSION(S) / ED DIAGNOSES   Final diagnoses:  Dyshidrotic eczema  Cystitis     Rx / DC Orders   ED Discharge Orders          Ordered    cephALEXin (KEFLEX) 500 MG capsule  2 times daily        10/22/23 1025             Note:  This document was prepared using Dragon voice recognition software and may include unintentional dictation errors.   Sharman Cheek, MD 10/22/23 1026

## 2023-10-22 NOTE — ED Notes (Signed)
Pt states that she started using monistat last tuesday and used it for 7 days, states this am she started her cycle and thinks she has a uti due to pain with urination, pt thinks she may have thrown something off with the cream

## 2023-10-22 NOTE — Discharge Instructions (Addendum)
Your urine test shows signs of a bladder infection. Take cephalexin as prescribed to resolve this issue.

## 2023-10-22 NOTE — ED Notes (Signed)
See triage notes. Patient c/o urinary frequency and burning with urination that started this morning.

## 2023-10-22 NOTE — ED Triage Notes (Signed)
Patient to ED via POV for urinary frequency and burning with urination- started started this AM.  Also states rash on left arm and chest- started a few days ago.

## 2023-11-21 ENCOUNTER — Other Ambulatory Visit: Payer: Self-pay

## 2023-11-21 ENCOUNTER — Emergency Department
Admission: EM | Admit: 2023-11-21 | Discharge: 2023-11-21 | Disposition: A | Discharge status: Home / Self Care | Payer: 59 | Attending: Emergency Medicine | Admitting: Emergency Medicine

## 2023-11-21 DIAGNOSIS — N6012 Diffuse cystic mastopathy of left breast: Secondary | ICD-10-CM | POA: Diagnosis not present

## 2023-11-21 DIAGNOSIS — N6011 Diffuse cystic mastopathy of right breast: Secondary | ICD-10-CM | POA: Insufficient documentation

## 2023-11-21 DIAGNOSIS — R21 Rash and other nonspecific skin eruption: Secondary | ICD-10-CM | POA: Diagnosis present

## 2023-11-21 DIAGNOSIS — L42 Pityriasis rosea: Secondary | ICD-10-CM | POA: Insufficient documentation

## 2023-11-21 MED ORDER — BETAMETHASONE VALERATE 0.1 % EX OINT: TOPICAL_OINTMENT | Freq: Every day | CUTANEOUS | 2 refills | Status: DC

## 2023-11-21 MED ORDER — HYDROXYZINE HCL 10 MG PO TABS: 10.0000 mg | ORAL_TABLET | Freq: Three times a day (TID) | ORAL | 0 refills | Status: AC | PRN

## 2023-11-21 NOTE — ED Triage Notes (Signed)
 Rash to body x 3 weeks

## 2023-11-21 NOTE — Discharge Instructions (Addendum)
 The skin rash you are describing appears to be consistent with pityriasis rosea.  I have printed out some additional patient summation for you to read about.  Take the antihistamine tablet as needed for itch relief.  Be careful with this medication may cause drowsiness.  Your breast exam is consistent with fibrocystic breast disease.  This condition is not precancerous, cancerous, and has no effect on long-term health.  It can however, make it more difficult to pick up on concerning breast changes with regular mammogram.  You should schedule with your primary provider to have a screening 3D mammogram soon as possible.  Should also have routine screening colonoscopy and a Pap smear to screen for colon cancer.

## 2023-11-21 NOTE — ED Provider Notes (Signed)
 [facility] Emergency Department Provider Note     Event Date/Time   First MD Initiated Contact with Patient 11/21/23 1807     (approximate)   History   Rash   HPI  Marie Sawyer is a 53 y.o. female with a history of depression and anxiety, and arthritis, presents to the ED for evaluation of a purulent rash to the body.  She would endorse 3 weeks of intermittent symptoms.  She endorses red flat scaly looking patches to the anterior chest over the breast, the arms, the trunk and the lower extremities.  Patient denies any known exposure, contact, or sensitivity.  She denies any history of eczema, allergies, or asthma.  Patient denies any fever, chills, sweats, chest pain.  She would have a secondary an unrelated complaint, of palpating a lump to her left breast.  Patient with no current medical home, endorses she has never had a mammogram, colonoscopy,, has not had a Pap smear in several years.  She denies any drainage from the nipple, nipple retraction, or overlying skin changes to the breast.  Physical Exam   Triage Vital Signs: ED Triage Vitals  Encounter Vitals Group     BP 11/21/23 1717 123/77     Systolic BP Percentile --      Diastolic BP Percentile --      Pulse Rate 11/21/23 1717 72     Resp 11/21/23 1717 16     Temp 11/21/23 1717 97.8 F (36.6 C)     Temp Source 11/21/23 1717 Oral     SpO2 11/21/23 1717 99 %     Weight 11/21/23 1715 164 lb 14.5 oz (74.8 kg)     Height --      Head Circumference --      Peak Flow --      Pain Score 11/21/23 1715 0     Pain Loc --      Pain Education --      Exclude from Growth Chart --     Most recent vital signs: Vitals:   11/21/23 1717 11/21/23 2000  BP: 123/77 125/88  Pulse: 72 77  Resp: 16 18  Temp: 97.8 F (36.6 C) 97.7 F (36.5 C)  SpO2: 99% 99%    General Awake, no distress. NAD HEENT NCAT. PERRL. EOMI. No rhinorrhea. Mucous membranes are moist.  CV:  Good peripheral perfusion.  RRR RESP:  Normal effort. CTA ABD:  No distention.  SKIN:  Patient with multiple maculopapular ovoid lesions noted across the chest, back, and extremities.  The lesions appear slightly erythematous with a scaly appearance.  No warmth, fluctuance, or induration is noted. BREAST: Patient with palpable fibrocystic changes to the breast bilaterally.  Multiple nontender, firm but mobile cystic and glandular lesions are noted in the breast bilaterally.  No nipple retraction, or peau d'orange skin changes appreciated.   ED Results / Procedures / Treatments   Labs (all labs ordered are listed, but only abnormal results are displayed) Labs Reviewed - No data to display   EKG    RADIOLOGY   No results found.   PROCEDURES:  Critical Care performed: No  Procedures   MEDICATIONS ORDERED IN ED: Medications - No data to display   IMPRESSION / MDM / ASSESSMENT AND PLAN / ED COURSE  I reviewed the triage vital signs and the nursing notes.  Differential diagnosis includes, but is not limited to, fibrocystic breast disease, inflammatory breast cancer, malignant neoplasm of the breast, contact dermatitis, pityriasis rosea, eczema exacerbation, dermatophyte infection  Patient's presentation is most consistent with acute, uncomplicated illness.  Patient's diagnosis is consistent with patient presents with a pruritic skin rash for the last 3 weeks, likely representing pityriasis rosea.  No fever, rash, or exposures noted.  Patient also found to have fibrocystic breast changes bilaterally. Patient will be discharged home with prescriptions for betamethasone ointment and hydroxyzine. Patient is to follow up with her primary provider to reestablish care for routine screening services including colonoscopy, mammogram, and Pap smear.  Patient is given ED precautions to return to the ED for any worsening or new symptoms.   FINAL CLINICAL IMPRESSION(S) / ED DIAGNOSES    Final diagnoses:  Pityriasis rosea  Fibrocystic breast changes of both breasts     Rx / DC Orders   ED Discharge Orders          Ordered    hydrOXYzine (ATARAX) 10 MG tablet  3 times daily PRN        11/21/23 1920    betamethasone valerate ointment (VALISONE) 0.1 %  Daily,   Status:  Discontinued        11/21/23 1920    betamethasone valerate ointment (VALISONE) 0.1 %  Daily,   Status:  Discontinued        11/21/23 1920    betamethasone valerate ointment (VALISONE) 0.1 %  Daily        11/21/23 1950             Note:  This document was prepared using Dragon voice recognition software and may include unintentional dictation errors.    Lissa Hoard, PA-C 11/21/23 2333    Corena Herter, MD 11/25/23 0000

## 2024-03-07 ENCOUNTER — Emergency Department (HOSPITAL_COMMUNITY)

## 2024-03-07 ENCOUNTER — Telehealth: Payer: Self-pay

## 2024-03-07 ENCOUNTER — Other Ambulatory Visit: Payer: Self-pay

## 2024-03-07 ENCOUNTER — Encounter (HOSPITAL_COMMUNITY): Payer: Self-pay

## 2024-03-07 ENCOUNTER — Emergency Department (HOSPITAL_COMMUNITY)
Admission: EM | Admit: 2024-03-07 | Discharge: 2024-03-07 | Disposition: A | Discharge status: Home / Self Care | Attending: Emergency Medicine | Admitting: Emergency Medicine

## 2024-03-07 DIAGNOSIS — M25512 Pain in left shoulder: Secondary | ICD-10-CM | POA: Diagnosis not present

## 2024-03-07 DIAGNOSIS — R0789 Other chest pain: Secondary | ICD-10-CM | POA: Insufficient documentation

## 2024-03-07 DIAGNOSIS — R110 Nausea: Secondary | ICD-10-CM | POA: Diagnosis not present

## 2024-03-07 DIAGNOSIS — M25412 Effusion, left shoulder: Secondary | ICD-10-CM | POA: Insufficient documentation

## 2024-03-07 LAB — CBC
HCT: 39.8 % (ref 36.0–46.0)
Hemoglobin: 13.5 g/dL (ref 12.0–15.0)
MCH: 29.8 pg (ref 26.0–34.0)
MCHC: 33.9 g/dL (ref 30.0–36.0)
MCV: 87.9 fL (ref 80.0–100.0)
Platelets: 337 10*3/uL (ref 150–400)
RBC: 4.53 MIL/uL (ref 3.87–5.11)
RDW: 12.8 % (ref 11.5–15.5)
WBC: 8.1 10*3/uL (ref 4.0–10.5)
nRBC: 0 % (ref 0.0–0.2)

## 2024-03-07 LAB — BASIC METABOLIC PANEL WITH GFR
Anion gap: 9 (ref 5–15)
BUN: 11 mg/dL (ref 6–20)
CO2: 23 mmol/L (ref 22–32)
Calcium: 9 mg/dL (ref 8.9–10.3)
Chloride: 105 mmol/L (ref 98–111)
Creatinine, Ser: 0.51 mg/dL (ref 0.44–1.00)
GFR, Estimated: >60 mL/min (ref >60)
Glucose, Bld: 137 mg/dL — ABNORMAL HIGH (ref 70–99)
Potassium: 4.5 mmol/L (ref 3.5–5.1)
Sodium: 137 mmol/L (ref 135–145)

## 2024-03-07 LAB — TROPONIN I (HIGH SENSITIVITY): Troponin I (High Sensitivity): <2 ng/L (ref <18)

## 2024-03-07 LAB — HCG, SERUM, QUALITATIVE: Preg, Serum: NEGATIVE

## 2024-03-07 MED ORDER — ACETAMINOPHEN 500 MG PO TABS
1000.0000 mg | ORAL_TABLET | Freq: Once | ORAL | Status: AC
  Administered 2024-03-07: 1000 mg via ORAL
  Filled 2024-03-07: qty 2

## 2024-03-07 MED ORDER — KETOROLAC TROMETHAMINE 15 MG/ML IJ SOLN
15.0000 mg | Freq: Once | INTRAMUSCULAR | Status: AC
  Administered 2024-03-07: 15 mg via INTRAMUSCULAR
  Filled 2024-03-07: qty 1

## 2024-03-07 MED ORDER — METHOCARBAMOL 500 MG PO TABS: 500.0000 mg | ORAL_TABLET | Freq: Once | ORAL | 0 refills | Status: AC

## 2024-03-07 MED ORDER — OXYCODONE HCL 5 MG PO TABS
5.0000 mg | ORAL_TABLET | Freq: Once | ORAL | Status: AC
  Administered 2024-03-07: 5 mg via ORAL
  Filled 2024-03-07: qty 1

## 2024-03-07 MED ORDER — LIDOCAINE 5 % EX PTCH
1.0000 | MEDICATED_PATCH | CUTANEOUS | Status: DC
  Administered 2024-03-07: 1 via TRANSDERMAL
  Filled 2024-03-07: qty 1

## 2024-03-07 NOTE — ED Notes (Signed)
 Pt ambulated to restroom without assistance.

## 2024-03-07 NOTE — ED Notes (Signed)
 MD at bedside.

## 2024-03-07 NOTE — Discharge Instructions (Addendum)
 Thank you for coming to [facility] Emergency Department. You were seen for left chest wall pain. We did an exam, labs, and imaging, and these showed likely musculoskeletal chest pain. Please continue alternating tylenol  and ibuprofen . You can also apply heat/ice, lidocaine  patches. Massage and stretching can help as well. Robaxin, a muscle relaxer, may help you. You can take 500 mg twice per day.   Please establish with a primary care physician. You had an enlarged heart on your chest xray and need an outpatient echocardiogram. Please see the attached resource guide for more information on low-cost primary medical care.   Please follow up with your primary care provider within 1 week.   Do not hesitate to return to the ED or call 911 if you experience: -Worsening symptoms -Leg swelling, shortness of breath -Lightheadedness, passing out -Fevers/chills -Anything else that concerns you

## 2024-03-07 NOTE — Telephone Encounter (Signed)
 Clarification needed for Robaxin as escript stated one dose, but quantity 14 tablets. Order should be every 8 hours PRN muscle spasms, this was corrected

## 2024-03-07 NOTE — ED Provider Notes (Signed)
 [place] EMERGENCY DEPARTMENT AT [facility] Provider Note   CSN: [id number] Arrival date & time: 03/07/24  1122     History  Chief Complaint  Patient presents with   Shoulder Pain    Marie Sawyer is a 53 y.o. female with PMH as listed below who presents with pain/swelling in left shoulder that started a week ago. Pt denies injury. Pt states it is warm to touch at times. Pt pointing to left chest and pain radiating into left neck and back. Pt denies shortness of breath, vomiting. Pain is not positional, pleuritic or exertional, just hurts constantly and doesn't stop. Taking tylenol /ibuprofen  at home which isn't helping. Also tried muscle rub at home which didn't help. Endorses nausea. Denies cough, fevers/chills, leg swelling. No h/o DVT/PE that she is aware of. No recent travel, surgeries, or hospitalizations. States she is having trouble sleeping d/t pain. No inciting incident or trauma noted.   Past Medical History:  Diagnosis Date   Anxiety    Arthritis    Back pain    Depression    Vertigo        Home Medications Prior to Admission medications   Medication Sig Start Date End Date Taking? Authorizing Provider  methocarbamol  (ROBAXIN ) 500 MG tablet Take 1 tablet (500 mg total) by mouth once for 1 dose. 03/07/24 03/07/24 Yes Franklyn Sid SAILOR, MD  betamethasone  valerate ointment (VALISONE ) 0.1 % Apply topically daily. 11/21/23   Margrette, Myah A, PA-C  buPROPion (WELLBUTRIN XL) 300 MG 24 hr tablet Take 300 mg by mouth daily.    [provider]  clonazePAM (KLONOPIN) 1 MG tablet Take 1 mg by mouth 2 (two) times daily as needed for anxiety.    [provider]  famotidine  (PEPCID ) 20 MG tablet Take 1 tablet (20 mg total) by mouth 2 (two) times daily. 02/25/20 03/26/20  Menshew, Candida LULLA Kings, PA-C  hydrOXYzine  (ATARAX ) 10 MG tablet Take 1 tablet (10 mg total) by mouth 3 (three) times daily as needed. 11/21/23   Menshew, Candida LULLA Kings, PA-C  metFORMIN   (GLUCOPHAGE ) 500 MG tablet Take 1 tablet (500 mg total) by mouth 2 (two) times daily with a meal. 01/31/23 03/02/23  Mumma, Clotilda, MD  zolpidem (AMBIEN) 10 MG tablet Take 10 mg by mouth at bedtime as needed for sleep.    [provider]      Allergies    Patient has no known allergies.    Review of Systems   Review of Systems A 10 point review of systems was performed and is negative unless otherwise reported in HPI.  Physical Exam Updated Vital Signs BP 139/85   Pulse 70   Temp 97.9 F (36.6 C)   Resp 16   Ht 4' 11 (1.499 m)   Wt 74.4 kg   LMP 10/25/2023 (Approximate)   SpO2 100%   BMI 33.12 kg/m  Physical Exam General: Normal appearing female, lying in bed.  HEENT: PERRLA, Sclera anicteric, MMM, trachea midline.  Cardiology: RRR, no murmurs/rubs/gallops. L anterior chest wall TTP that is reproducible.  Resp: Normal respiratory rate and effort. CTAB, no wheezes, rhonchi, crackles.  Abd: Soft, non-tender, non-distended. No rebound tenderness or guarding.  GU: Deferred. MSK: No peripheral edema or signs of trauma. Extremities without deformity or TTP. No cyanosis or clubbing. Skin: warm, dry. No rashes or lesions. Back: No CVA tenderness Neuro: A&Ox4, CNs II-XII grossly intact. MAEs. Sensation grossly intact.  Psych: Normal mood and affect.   ED Results /  Procedures / Treatments   Labs (all labs ordered are listed, but only abnormal results are displayed) Labs Reviewed  BASIC METABOLIC PANEL WITH GFR - Abnormal; Notable for the following components:      Result Value   Glucose, Bld 137 (*)    All other components within normal limits  HCG, SERUM, QUALITATIVE  CBC  TROPONIN I (HIGH SENSITIVITY)    EKG EKG Interpretation Date/Time:  Sunday March 07 2024 11:40:20 EDT Ventricular Rate:  77 PR Interval:  140 QRS Duration:  74 QT Interval:  356 QTC Calculation: 402 R Axis:   30  Text Interpretation: Normal sinus rhythm Normal ECG Confirmed by Franklyn Gills  [phone]) on 03/07/2024 12:33:37 PM  Radiology DG Shoulder Left Result Date: 03/07/2024 CLINICAL DATA:  L shoulder pain EXAM: LEFT SHOULDER - 2+ VIEW COMPARISON:  August 28, 23 FINDINGS: No acute fracture or dislocation. Joint alignment is maintained. Subcortical cyst at the site of rotator cuff insertion. No area of erosion or osseous destruction. No unexpected radiopaque foreign body. Soft tissues are unremarkable. IMPRESSION: 1. No acute fracture or dislocation. 2. Degenerative changes at the site of rotator cuff insertion. Electronically Signed   By: Corean Salter M.D.   On: 03/07/2024 14:53   DG Chest 2 View Result Date: 03/07/2024 CLINICAL DATA:  chest pain EXAM: CHEST - 2 VIEW COMPARISON:  08/10/2022. FINDINGS: Cardiac silhouette enlarged. No evidence of pneumothorax or pleural effusion. No evidence of pulmonary edema. No osseous abnormalities identified. IMPRESSION: Enlarged cardiac silhouette. Electronically Signed   By: Fonda Field M.D.   On: 03/07/2024 12:32    Procedures Ultrasound ED Echo  Date/Time: 03/15/2024 6:47 PM  Performed by: Franklyn Gills SAILOR, MD Authorized by: Franklyn Gills SAILOR, MD   Procedure details:    Indications: chest pain     Views: subxiphoid, parasternal long axis view, parasternal short axis view and apical 4 chamber view     Images: not archived     Limitations:  Body habitus Findings:    Pericardium: no pericardial effusion     LV Function: normal (>50% EF)     RV Diameter: normal   Impression:    Impression: normal       Medications Ordered in ED Medications  lidocaine  (LIDODERM ) 5 % 1 patch (1 patch Transdermal Patch Applied 03/07/24 1448)  oxyCODONE  (Oxy IR/ROXICODONE ) immediate release tablet 5 mg (5 mg Oral Given 03/07/24 1447)  ketorolac  (TORADOL ) 15 MG/ML injection 15 mg (15 mg Intramuscular Given 03/07/24 1447)  acetaminophen  (TYLENOL ) tablet 1,000 mg (1,000 mg Oral Given 03/07/24 1446)    ED Course/ Medical Decision Making/ A&P                           Medical Decision Making Amount and/or Complexity of Data Reviewed Labs: ordered. Decision-making details documented in ED Course. Radiology: ordered. Decision-making details documented in ED Course.  Risk OTC drugs. Prescription drug management.    This patient presents to the ED for concern of chest pain, this involves an extensive number of treatment options, and is a complaint that carries with it a high risk of complications and morbidity.  I considered the following differential and admission for this acute, potentially life threatening condition.   MDM:    DDX for chest pain includes but is not limited to:  Very low suspicion for ACS vs aortic dissection given presenting sx. Pt w/ reproducible chest wall TTP, patient likely with chest wall pain or musculoskeletal  pain. She has negative CXR and shoulder XR for acute traumatic or bony injuries. CXR does show enlarged cardiac silhouette - she has no leg swelling, SOB, pulm edema, or crackles to indicate acute HF exacerbation. Her pain is not pleuritic and she has no positional or exertional sxs to indicate pericardial effusion, pericarditis, PE, or ACS. EKG w/o ischemic findings. Her chest wall is tender and this is the pain she feels with palpation, so I believe it is musculoskeletal. Pain seems to be coming from her chest and not her shoulder. No swelling, erythema, or induration to indicate cellulitis. She was given tylenol , toradol , lidocaine  patch, and oxycodone  with some improvement in her sxs.  Given that she did have a mildly enlarged cardiac silhouette on the chest x-ray, I did perform a bedside echo which did not demonstrate any pericardial effusion.  Believe patient is safe to follow-up with PCP or cardiology in the outpatient setting. She is informed that she will need a formal echocardiogram for further evaluation. She reports understanding.   Clinical Course as of 03/07/24 1516  Sun Mar 07, 2024  1246 DG Chest  2 View Enlarged cardiac silhouette. [HN]  1246 CBC neg [HN]  1354 Troponin I (High Sensitivity): <2 neg [HN]  1354 Preg, Serum: NEGATIVE [HN]  1354 Basic metabolic panel(!) Unremarkable [HN]  1502 Preg, Serum: NEGATIVE [HN]  1502 DG Shoulder Left 1. No acute fracture or dislocation. 2. Degenerative changes at the site of rotator cuff insertion.   [HN]    Clinical Course User Index [HN] Franklyn Sid SAILOR, MD    Labs: I Ordered, and personally interpreted labs.  The pertinent results include:  those listed above  Imaging Studies ordered: I ordered imaging studies including CXR, L shoulder XR I independently visualized and interpreted imaging. I agree with the radiologist interpretation  Additional history obtained from chart review  Cardiac Monitoring: The patient was maintained on a cardiac monitor.  I personally viewed and interpreted the cardiac monitored which showed an underlying rhythm of: NSR  Reevaluation: After the interventions noted above, I reevaluated the patient and found that they have :improved  Social Determinants of Health: Lives independently  Disposition:  DC w/ discharge instructions/return precautions. All questions answered to patient's satisfaction.    Co morbidities that complicate the patient evaluation  Past Medical History:  Diagnosis Date   Anxiety    Arthritis    Back pain    Depression    Vertigo      Medicines Meds ordered this encounter  Medications   oxyCODONE  (Oxy IR/ROXICODONE ) immediate release tablet 5 mg    Refill:  0   ketorolac  (TORADOL ) 15 MG/ML injection 15 mg   acetaminophen  (TYLENOL ) tablet 1,000 mg   lidocaine  (LIDODERM ) 5 % 1 patch   methocarbamol  (ROBAXIN ) 500 MG tablet    Sig: Take 1 tablet (500 mg total) by mouth once for 1 dose.    Dispense:  14 tablet    Refill:  0    I have reviewed the patients home medicines and have made adjustments as needed  Problem List / ED Course: Problem List Items Addressed  This Visit   None Visit Diagnoses       Musculoskeletal chest pain    -  Primary                   This note was created using dictation software, which may contain spelling or grammatical errors.    Franklyn Sid SAILOR, MD 03/15/24 [phone]

## 2024-03-07 NOTE — ED Triage Notes (Signed)
 Pt states she has had swelling in left shoulder and pain that started a week ago. Pt denies injury. Pt states it is warm to touch at times. Pt pointing to left chest and pain radiating into left neck and back. Pt denies shortness of breath, nausea or vomiting.

## 2024-05-14 ENCOUNTER — Emergency Department

## 2024-05-14 ENCOUNTER — Emergency Department
Admission: EM | Admit: 2024-05-14 | Discharge: 2024-05-14 | Discharge status: Against Medical Advice | Attending: Emergency Medicine | Admitting: Emergency Medicine

## 2024-05-14 ENCOUNTER — Other Ambulatory Visit: Payer: Self-pay

## 2024-05-14 DIAGNOSIS — Z5321 Procedure and treatment not carried out due to patient leaving prior to being seen by health care provider: Secondary | ICD-10-CM | POA: Diagnosis not present

## 2024-05-14 DIAGNOSIS — R42 Dizziness and giddiness: Secondary | ICD-10-CM | POA: Insufficient documentation

## 2024-05-14 DIAGNOSIS — R079 Chest pain, unspecified: Secondary | ICD-10-CM | POA: Insufficient documentation

## 2024-05-14 LAB — BASIC METABOLIC PANEL WITH GFR
Anion gap: 6 (ref 5–15)
BUN: 10 mg/dL (ref 6–20)
CO2: 25 mmol/L (ref 22–32)
Calcium: 9 mg/dL (ref 8.9–10.3)
Chloride: 108 mmol/L (ref 98–111)
Creatinine, Ser: 0.54 mg/dL (ref 0.44–1.00)
GFR, Estimated: >60 mL/min (ref >60)
Glucose, Bld: 191 mg/dL — ABNORMAL HIGH (ref 70–99)
Potassium: 3.7 mmol/L (ref 3.5–5.1)
Sodium: 139 mmol/L (ref 135–145)

## 2024-05-14 LAB — TROPONIN I (HIGH SENSITIVITY): Troponin I (High Sensitivity): <2 ng/L (ref <18)

## 2024-05-14 LAB — CBC
HCT: 40 % (ref 36.0–46.0)
Hemoglobin: 13.8 g/dL (ref 12.0–15.0)
MCH: 29.7 pg (ref 26.0–34.0)
MCHC: 34.5 g/dL (ref 30.0–36.0)
MCV: 86 fL (ref 80.0–100.0)
Platelets: 329 K/uL (ref 150–400)
RBC: 4.65 MIL/uL (ref 3.87–5.11)
RDW: 12.2 % (ref 11.5–15.5)
WBC: 8.3 K/uL (ref 4.0–10.5)
nRBC: 0 % (ref 0.0–0.2)

## 2024-05-14 NOTE — ED Triage Notes (Signed)
 Patient ambulatory to triage with complaints of worsening chest pain today. States she has had pain off and on for the past couple of weeks. Has hx of enlarged heart and murmur. Has accompanying dizziness, denies other symptoms.

## 2024-05-19 ENCOUNTER — Emergency Department (HOSPITAL_COMMUNITY)

## 2024-05-19 ENCOUNTER — Emergency Department (HOSPITAL_COMMUNITY)
Admission: EM | Admit: 2024-05-19 | Discharge: 2024-05-19 | Disposition: A | Discharge status: Home / Self Care | Attending: Emergency Medicine | Admitting: Emergency Medicine

## 2024-05-19 ENCOUNTER — Other Ambulatory Visit: Payer: Self-pay

## 2024-05-19 ENCOUNTER — Encounter (HOSPITAL_COMMUNITY): Payer: Self-pay

## 2024-05-19 DIAGNOSIS — Z7984 Long term (current) use of oral hypoglycemic drugs: Secondary | ICD-10-CM | POA: Insufficient documentation

## 2024-05-19 DIAGNOSIS — R0602 Shortness of breath: Secondary | ICD-10-CM | POA: Insufficient documentation

## 2024-05-19 DIAGNOSIS — R079 Chest pain, unspecified: Secondary | ICD-10-CM

## 2024-05-19 DIAGNOSIS — R011 Cardiac murmur, unspecified: Secondary | ICD-10-CM | POA: Insufficient documentation

## 2024-05-19 DIAGNOSIS — R0789 Other chest pain: Secondary | ICD-10-CM | POA: Insufficient documentation

## 2024-05-19 LAB — CBC
HCT: 41.9 % (ref 36.0–46.0)
Hemoglobin: 14.4 g/dL (ref 12.0–15.0)
MCH: 29.8 pg (ref 26.0–34.0)
MCHC: 34.4 g/dL (ref 30.0–36.0)
MCV: 86.6 fL (ref 80.0–100.0)
Platelets: 337 K/uL (ref 150–400)
RBC: 4.84 MIL/uL (ref 3.87–5.11)
RDW: 12.5 % (ref 11.5–15.5)
WBC: 8.9 K/uL (ref 4.0–10.5)
nRBC: 0 % (ref 0.0–0.2)

## 2024-05-19 LAB — BASIC METABOLIC PANEL WITH GFR
Anion gap: 11 (ref 5–15)
BUN: 17 mg/dL (ref 6–20)
CO2: 22 mmol/L (ref 22–32)
Calcium: 9.4 mg/dL (ref 8.9–10.3)
Chloride: 103 mmol/L (ref 98–111)
Creatinine, Ser: 0.58 mg/dL (ref 0.44–1.00)
GFR, Estimated: >60 mL/min (ref >60)
Glucose, Bld: 231 mg/dL — ABNORMAL HIGH (ref 70–99)
Potassium: 3.6 mmol/L (ref 3.5–5.1)
Sodium: 136 mmol/L (ref 135–145)

## 2024-05-19 LAB — TROPONIN I (HIGH SENSITIVITY): Troponin I (High Sensitivity): <2 ng/L (ref <18)

## 2024-05-19 LAB — BRAIN NATRIURETIC PEPTIDE: B Natriuretic Peptide: 5 pg/mL (ref 0.0–100.0)

## 2024-05-19 LAB — D-DIMER, QUANTITATIVE: D-Dimer, Quant: 0.35 ug{FEU}/mL (ref 0.00–0.50)

## 2024-05-19 NOTE — ED Provider Notes (Signed)
 [place] EMERGENCY DEPARTMENT AT [facility] Provider Note   CSN: [id number] Arrival date & time: 05/19/24  1028     Patient presents with: Chest Pain and Shortness of Breath   Marie Sawyer is a 53 y.o. female.  {Add pertinent medical, surgical, social history, OB history to HPI:4043} 53 year old female with history of diabetes who presents the emergency department with chest discomfort.  Reports it has been ongoing for several months and worsened over the past few days.  Right sided.  Pressure-like.  3/10 in severity at this point in time.  Not exertional but is pleuritic.  Shortness of breath and cough recently.  Thinks she may have had some sweating earlier today but denies diaphoresis otherwise.  No vomiting.  No history of DVT or PE or cancer.  Not on hormones.       Prior to Admission medications   Medication Sig Start Date End Date Taking? Authorizing Provider  betamethasone  valerate ointment (VALISONE ) 0.1 % Apply topically daily. 11/21/23   Margrette, Myah A, PA-C  buPROPion (WELLBUTRIN XL) 300 MG 24 hr tablet Take 300 mg by mouth daily.    [provider]  clonazePAM (KLONOPIN) 1 MG tablet Take 1 mg by mouth 2 (two) times daily as needed for anxiety.    [provider]  famotidine  (PEPCID ) 20 MG tablet Take 1 tablet (20 mg total) by mouth 2 (two) times daily. 02/25/20 03/26/20  Menshew, Candida LULLA Kings, PA-C  hydrOXYzine  (ATARAX ) 10 MG tablet Take 1 tablet (10 mg total) by mouth 3 (three) times daily as needed. 11/21/23   Menshew, Candida LULLA Kings, PA-C  metFORMIN  (GLUCOPHAGE ) 500 MG tablet Take 1 tablet (500 mg total) by mouth 2 (two) times daily with a meal. 01/31/23 03/02/23  Mumma, Clotilda, MD  zolpidem (AMBIEN) 10 MG tablet Take 10 mg by mouth at bedtime as needed for sleep.    [provider]    Allergies: Patient has no known allergies.    Review of Systems  Updated Vital Signs Pulse 84   Temp 97.8 F (36.6 C) (Oral)   Resp 17    Ht 4' 10 (1.473 m)   Wt 74.4 kg   LMP 10/28/2023 (Approximate)   SpO2 97%   BMI 34.28 kg/m   Physical Exam Vitals and nursing note reviewed.  Constitutional:      General: She is not in acute distress.    Appearance: She is well-developed.  HENT:     Head: Normocephalic and atraumatic.     Right Ear: External ear normal.     Left Ear: External ear normal.     Nose: Nose normal.  Eyes:     Extraocular Movements: Extraocular movements intact.     Conjunctiva/sclera: Conjunctivae normal.     Pupils: Pupils are equal, round, and reactive to light.  Cardiovascular:     Rate and Rhythm: Normal rate and regular rhythm.     Heart sounds: Murmur (2/6 systolic) heard.     Comments: Chest pain reproducible.  Radial pulses 2+ bilaterally Pulmonary:     Effort: Pulmonary effort is normal. No respiratory distress.     Breath sounds: Normal breath sounds.  Musculoskeletal:     Cervical back: Normal range of motion and neck supple.     Right lower leg: No edema.     Left lower leg: No edema.  Skin:    General: Skin is warm and dry.  Neurological:     Mental Status: She is alert  and oriented to person, place, and time. Mental status is at baseline.  Psychiatric:        Mood and Affect: Mood normal.     (all labs ordered are listed, but only abnormal results are displayed) Labs Reviewed  BASIC METABOLIC PANEL WITH GFR  CBC  BRAIN NATRIURETIC PEPTIDE  D-DIMER, QUANTITATIVE  TROPONIN I (HIGH SENSITIVITY)    EKG: None  Radiology: No results found.  {Document cardiac monitor, telemetry assessment procedure when appropriate:32947} Procedures   Medications Ordered in the ED - No data to display    {Click here for ABCD2, HEART and other calculators REFRESH Note before signing:1}                              Medical Decision Making Amount and/or Complexity of Data Reviewed Labs: ordered. Radiology: ordered.   ***  {Document critical care time when appropriate  Document  review of labs and clinical decision tools ie [id number], etc  Document your independent review of radiology images and any outside records  Document your discussion with family members, caretakers and with consultants  Document social determinants of health affecting pt's care  Document your decision making why or why not admission, treatments were needed:32947:::1}   Final diagnoses:  None    ED Discharge Orders     None

## 2024-05-19 NOTE — Discharge Instructions (Signed)
 You were seen for your chest pain in the emergency department.   At home, please take Tylenol and ibuprofen for your pain.    Follow-up with your primary doctor in 2-3 days regarding your visit.  Cardiology will be calling you regarding an appointment within the next 72 hours.  You may contact them if you do not hear from them in that time using the information in this packet.  Return immediately to the emergency department if you experience any of the following: Worsening pain, difficulty breathing, unexplained vomiting or sweating, or any other concerning symptoms.    Thank you for visiting our Emergency Department. It was a pleasure taking care of you today.

## 2024-05-19 NOTE — ED Triage Notes (Addendum)
 Patient come in POV for complaint of chest pain described as pressure mid-right side, has shortness of breath from pain. Has been going on for a couple of days, got worse this morning. Was informed two months ago that she has a enlarged heart.Karna taking any medication for the pain. Intermittent pain 7/10.

## 2024-06-12 ENCOUNTER — Encounter (HOSPITAL_COMMUNITY): Payer: Self-pay

## 2024-06-12 ENCOUNTER — Emergency Department (HOSPITAL_COMMUNITY)

## 2024-06-12 ENCOUNTER — Other Ambulatory Visit: Payer: Self-pay

## 2024-06-12 ENCOUNTER — Emergency Department (HOSPITAL_COMMUNITY)
Admission: EM | Admit: 2024-06-12 | Discharge: 2024-06-12 | Disposition: A | Discharge status: Home / Self Care | Attending: Emergency Medicine | Admitting: Emergency Medicine

## 2024-06-12 DIAGNOSIS — R739 Hyperglycemia, unspecified: Secondary | ICD-10-CM | POA: Insufficient documentation

## 2024-06-12 DIAGNOSIS — S0083XA Contusion of other part of head, initial encounter: Secondary | ICD-10-CM | POA: Diagnosis not present

## 2024-06-12 DIAGNOSIS — R519 Headache, unspecified: Secondary | ICD-10-CM | POA: Diagnosis present

## 2024-06-12 LAB — CBG MONITORING, ED: Glucose-Capillary: 238 mg/dL — ABNORMAL HIGH (ref 70–99)

## 2024-06-12 MED ORDER — IBUPROFEN 400 MG PO TABS
600.0000 mg | ORAL_TABLET | Freq: Once | ORAL | Status: AC
  Administered 2024-06-12: 600 mg via ORAL
  Filled 2024-06-12: qty 2

## 2024-06-12 MED ORDER — METFORMIN HCL 500 MG PO TABS: 500.0000 mg | ORAL_TABLET | Freq: Two times a day (BID) | ORAL | 0 refills | Status: AC
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
## 2024-06-12 NOTE — ED Notes (Signed)
 [facility] is coming to take pt home.

## 2024-08-30 ENCOUNTER — Ambulatory Visit: Payer: Self-pay | Admitting: General Practice

## 2024-08-30 DIAGNOSIS — K0889 Other specified disorders of teeth and supporting structures: Secondary | ICD-10-CM

## 2024-08-30 NOTE — Progress Notes (Signed)
 Marie Sawyer presents to the clinic this afternoon requesting Ibuprofen  and/or Tylenol  for tooth pain. She reports thinking a filling has come out of a tooth on the left lower side of her mouth. She has stated in a prior visit to the clinic not having the money for dentist co-payments. This am, she states it feels like the pain is getting worse. Denies the need for VS to be taken.   RN provided OTC Ibuprofen  and Acetaminophen  and instructed her to take with a snack to coat her stomach. Requested she return to the clinic for dental information today or tomorrow.   RN researched several internet sites for resources of discounted or free dental services in [place] and [place]. Information printed on the Dental Hygiene clinic at [facility] [place] [facility] and [facility] (5 locations in this area). RN will provide these community resources and discuss further with [name] and assist as needed.

## 2024-08-31 ENCOUNTER — Telehealth: Payer: Self-pay | Admitting: Registered Nurse

## 2024-08-31 ENCOUNTER — Encounter: Payer: Self-pay | Admitting: Registered Nurse

## 2024-08-31 DIAGNOSIS — R739 Hyperglycemia, unspecified: Secondary | ICD-10-CM

## 2024-08-31 DIAGNOSIS — K0889 Other specified disorders of teeth and supporting structures: Secondary | ICD-10-CM

## 2024-08-31 DIAGNOSIS — Z8639 Personal history of other endocrine, nutritional and metabolic disease: Secondary | ICD-10-CM

## 2024-08-31 NOTE — Telephone Encounter (Signed)
 Patient to clinic today to pick up free or low cost dental information.  Cracked tooth left front intermittent pain taking ibuprofen  and tylenol  OTC prn.  Has not had money or insurance for dental and recently started new job with temporary agency and getting paid today and plans to contact provider to schedule now that she can afford appt.    Patient given this printed information [facility]: [place] [facility] [phone], ext. [phone]; Dental Hygiene only [facility] [place] # [phone]; exams, x-rays, extractions fillings on a sliding scale fee. [facility], Dental Dept. # [phone] (Also Medical, Behavioral Health, Pharmacy & Nutrition Services. On a sliding scale fee [facility]; The [phone] Plan for $300 per year = X-rays, 2 cleanings/year, 1 emergent appt, fillings; Other services are 24% off   Exitcare handouts on dental injury/dental pain/dental abscess.  Afebrile no discharge gum swollen mildly around tooth with pain and appears to have lost a cusp no bleeding or discharge  Spoke full sentences without difficulty A&Ox3 gait sure and steady respirations even and unlabored  Discussed with patient that there are free or low cost medical clinics in [place] [facility] [address] phone [phone] and [facility] [address]  She stated she has been out of metformin  for quite a while.  Discussed [facility] has cash pay list   Recommended listerine or salt water gargles until she can be seen by dental provider.  Brush after each meal and floss twice a day/after meals/snacks prn  Patient reported ran out of metformin  500mg  po BID quite a while ago.  Last blood sugar check 06/12/2024 238.  POC glucose performed today 212 nonfasting.  Discussed with patient avoid dehydration, contact one of free clinics for appt.  Consider having them send Rx to [facility] if  price for medication more expensive than listed on handout.  Patient currently without a PCM.  Patient reported she has history of type 2 diabetes has not had Hgba1c tested per epic and she cannot remember anything except for finger stick being tested in the past year.  Try to keep added sugars in diet less than 100 calories/5 teaspoons/25 grams per day.  Exitcare handout on living with diabetes and nutrition sent to my chart.  Unable to send in rx for patient as not eligible for care at [facility].  Spoke with HR Tonya for exception to policy and was told unable to see temporary employees at [employer] for appts/dispense medications or send in Rx except for cases of patient injury at work to stabilize prior to engineer, petroleum for further instructions as each company procedure varies.  Patient notified of policy per HR.  Patient verbalized understanding information/instructions and had no further questions at this time.
